# Patient Record
Sex: Female | Born: 1995 | Hispanic: No | Marital: Single | Smoking: Never smoker
Health system: Southern US, Community
[De-identification: ages and names within clinical notes are randomized; demographics above are authoritative.]

## PROBLEM LIST (undated history)

## (undated) ENCOUNTER — Inpatient Hospital Stay (HOSPITAL_COMMUNITY): Payer: Self-pay

## (undated) ENCOUNTER — Ambulatory Visit: Admission: EM | Payer: Medicaid Other

## (undated) DIAGNOSIS — B009 Herpesviral infection, unspecified: Secondary | ICD-10-CM

## (undated) HISTORY — PX: NO PAST SURGERIES: SHX2092

---

## 2020-02-29 ENCOUNTER — Emergency Department (HOSPITAL_COMMUNITY): Payer: Self-pay

## 2020-02-29 ENCOUNTER — Encounter (HOSPITAL_COMMUNITY): Payer: Self-pay

## 2020-02-29 ENCOUNTER — Emergency Department (HOSPITAL_COMMUNITY)
Admission: EM | Admit: 2020-02-29 | Discharge: 2020-03-01 | Disposition: A | Discharge status: Home / Self Care | Payer: Self-pay | Attending: Emergency Medicine | Admitting: Emergency Medicine

## 2020-02-29 DIAGNOSIS — K529 Noninfective gastroenteritis and colitis, unspecified: Secondary | ICD-10-CM | POA: Insufficient documentation

## 2020-02-29 DIAGNOSIS — R1030 Lower abdominal pain, unspecified: Secondary | ICD-10-CM | POA: Insufficient documentation

## 2020-02-29 DIAGNOSIS — N3000 Acute cystitis without hematuria: Secondary | ICD-10-CM | POA: Insufficient documentation

## 2020-02-29 HISTORY — DX: Herpesviral infection, unspecified: B00.9

## 2020-02-29 LAB — CBC
HCT: 49.5 % — ABNORMAL HIGH (ref 36.0–46.0)
Hemoglobin: 16.1 g/dL — ABNORMAL HIGH (ref 12.0–15.0)
MCH: 29.8 pg (ref 26.0–34.0)
MCHC: 32.5 g/dL (ref 30.0–36.0)
MCV: 91.7 fL (ref 80.0–100.0)
Platelets: 423 10*3/uL — ABNORMAL HIGH (ref 150–400)
RBC: 5.4 MIL/uL — ABNORMAL HIGH (ref 3.87–5.11)
RDW: 12.9 % (ref 11.5–15.5)
WBC: 26.1 10*3/uL — ABNORMAL HIGH (ref 4.0–10.5)
nRBC: 0 % (ref 0.0–0.2)

## 2020-02-29 LAB — COMPREHENSIVE METABOLIC PANEL
ALT: 14 U/L (ref 0–44)
AST: 17 U/L (ref 15–41)
Albumin: 4.3 g/dL (ref 3.5–5.0)
Alkaline Phosphatase: 60 U/L (ref 38–126)
Anion gap: 16 — ABNORMAL HIGH (ref 5–15)
BUN: 8 mg/dL (ref 6–20)
CO2: 21 mmol/L — ABNORMAL LOW (ref 22–32)
Calcium: 9.9 mg/dL (ref 8.9–10.3)
Chloride: 103 mmol/L (ref 98–111)
Creatinine, Ser: 0.87 mg/dL (ref 0.44–1.00)
GFR calc Af Amer: >60 mL/min (ref >60)
GFR calc non Af Amer: >60 mL/min (ref >60)
Glucose, Bld: 130 mg/dL — ABNORMAL HIGH (ref 70–99)
Potassium: 4 mmol/L (ref 3.5–5.1)
Sodium: 140 mmol/L (ref 135–145)
Total Bilirubin: 2.3 mg/dL — ABNORMAL HIGH (ref 0.3–1.2)
Total Protein: 8.3 g/dL — ABNORMAL HIGH (ref 6.5–8.1)

## 2020-02-29 LAB — URINALYSIS, ROUTINE W REFLEX MICROSCOPIC
Bilirubin Urine: NEGATIVE
Glucose, UA: 50 mg/dL — AB
Ketones, ur: 80 mg/dL — AB
Leukocytes,Ua: NEGATIVE
Nitrite: NEGATIVE
Protein, ur: >=300 mg/dL — AB
RBC / HPF: >50 RBC/hpf — ABNORMAL HIGH (ref 0–5)
Specific Gravity, Urine: 1.032 — ABNORMAL HIGH (ref 1.005–1.030)
WBC, UA: >50 WBC/hpf — ABNORMAL HIGH (ref 0–5)
pH: 5 (ref 5.0–8.0)

## 2020-02-29 LAB — LIPASE, BLOOD: Lipase: 17 U/L (ref 11–51)

## 2020-02-29 LAB — I-STAT BETA HCG BLOOD, ED (MC, WL, AP ONLY): I-stat hCG, quantitative: <5 m[IU]/mL (ref <5)

## 2020-02-29 MED ORDER — ONDANSETRON HCL 4 MG/2ML IJ SOLN
4.0000 mg | Freq: Once | INTRAMUSCULAR | Status: AC
  Administered 2020-02-29: 4 mg via INTRAVENOUS
  Filled 2020-02-29: qty 2

## 2020-02-29 MED ORDER — CIPROFLOXACIN HCL 500 MG PO TABS
500.0000 mg | ORAL_TABLET | Freq: Two times a day (BID) | ORAL | 0 refills | Status: DC
Start: 2020-02-29 — End: 2020-11-14

## 2020-02-29 MED ORDER — ONDANSETRON 8 MG PO TBDP: 8.0000 mg | ORAL_TABLET | Freq: Three times a day (TID) | ORAL | 0 refills | Status: DC | PRN

## 2020-02-29 MED ORDER — SODIUM CHLORIDE 0.9% FLUSH
3.0000 mL | Freq: Once | INTRAVENOUS | Status: AC
  Administered 2020-02-29: 3 mL via INTRAVENOUS

## 2020-02-29 MED ORDER — ONDANSETRON 4 MG PO TBDP
4.0000 mg | ORAL_TABLET | Freq: Once | ORAL | Status: AC | PRN
  Administered 2020-02-29: 4 mg via ORAL
  Filled 2020-02-29: qty 1

## 2020-02-29 MED ORDER — SODIUM CHLORIDE 0.9 % IV SOLN
1.0000 g | Freq: Once | INTRAVENOUS | Status: AC
  Administered 2020-02-29: 1 g via INTRAVENOUS
  Filled 2020-02-29: qty 10

## 2020-02-29 MED ORDER — IOHEXOL 300 MG/ML  SOLN
100.0000 mL | Freq: Once | INTRAMUSCULAR | Status: AC | PRN
  Administered 2020-02-29: 100 mL via INTRAVENOUS

## 2020-02-29 MED ORDER — DICYCLOMINE HCL 20 MG PO TABS
20.0000 mg | ORAL_TABLET | Freq: Two times a day (BID) | ORAL | 0 refills | Status: DC
Start: 2020-02-29 — End: 2020-11-14

## 2020-02-29 MED ORDER — MORPHINE SULFATE (PF) 4 MG/ML IV SOLN
4.0000 mg | Freq: Once | INTRAVENOUS | Status: AC
  Administered 2020-02-29: 4 mg via INTRAVENOUS
  Filled 2020-02-29: qty 1

## 2020-02-29 NOTE — ED Provider Notes (Signed)
[facility] EMERGENCY DEPARTMENT Provider Note   CSN: [id number] Arrival date & time: 02/29/20  1750     History Chief Complaint  Patient presents with  . Abdominal Pain    Robin Hall is a 24 y.o. female.  HPI   Patient presents to the ED for evaluation of abdominal pain.  Patient states the symptoms started today.  She has had issues with nausea and vomiting and diarrhea.  Patient began having severe pain primarily in her lower abdomen.  Patient denies any dysuria.  She denies any vaginal discharge or bleeding.    Past Medical History:  Diagnosis Date  . Herpes simplex     History reviewed. No pertinent surgical history.   OB History   No obstetric history on file.     No family history on file.  Social History   Tobacco Use  . Smoking status: Not on file  Substance Use Topics  . Alcohol use: Not on file  . Drug use: Not on file    Home Medications Prior to Admission medications   Medication Sig Start Date End Date Taking? Authorizing Provider  ciprofloxacin (CIPRO) 500 MG tablet Take 1 tablet (500 mg total) by mouth 2 (two) times daily. 02/29/20   Linwood Dibbles, MD  dicyclomine (BENTYL) 20 MG tablet Take 1 tablet (20 mg total) by mouth 2 (two) times daily. 02/29/20   Linwood Dibbles, MD  ondansetron (ZOFRAN ODT) 8 MG disintegrating tablet Take 1 tablet (8 mg total) by mouth every 8 (eight) hours as needed for nausea or vomiting. 02/29/20   Linwood Dibbles, MD    Allergies    Patient has no allergy information on record.  Review of Systems   Review of Systems  All other systems reviewed and are negative.   Physical Exam Updated Vital Signs BP (!) 98/58 (BP Location: Right Arm)   Pulse 90   Temp 98 F (36.7 C) (Oral)   Resp 16   SpO2 95%   Physical Exam Vitals and nursing note reviewed.  Constitutional:      General: She is not in acute distress.    Appearance: She is well-developed.  HENT:     Head: Normocephalic and atraumatic.     Right  Ear: External ear normal.     Left Ear: External ear normal.  Eyes:     General: No scleral icterus.       Right eye: No discharge.        Left eye: No discharge.     Conjunctiva/sclera: Conjunctivae normal.  Neck:     Trachea: No tracheal deviation.  Cardiovascular:     Rate and Rhythm: Normal rate and regular rhythm.  Pulmonary:     Effort: Pulmonary effort is normal. No respiratory distress.     Breath sounds: Normal breath sounds. No stridor. No wheezing or rales.  Abdominal:     General: Bowel sounds are normal. There is no distension.     Palpations: Abdomen is soft.     Tenderness: There is abdominal tenderness in the right lower quadrant, suprapubic area and left lower quadrant. There is no guarding or rebound.     Hernia: No hernia is present.  Musculoskeletal:        General: No tenderness.     Cervical back: Neck supple.  Skin:    General: Skin is warm and dry.     Findings: No rash.  Neurological:     Mental Status: She is alert.  Cranial Nerves: No cranial nerve deficit (no facial droop, extraocular movements intact, no slurred speech).     Sensory: No sensory deficit.     Motor: No abnormal muscle tone or seizure activity.     Coordination: Coordination normal.     ED Results / Procedures / Treatments   Labs (all labs ordered are listed, but only abnormal results are displayed) Labs Reviewed  COMPREHENSIVE METABOLIC PANEL - Abnormal; Notable for the following components:      Result Value   CO2 21 (*)    Glucose, Bld 130 (*)    Total Protein 8.3 (*)    Total Bilirubin 2.3 (*)    Anion gap 16 (*)    All other components within normal limits  CBC - Abnormal; Notable for the following components:   WBC 26.1 (*)    RBC 5.40 (*)    Hemoglobin 16.1 (*)    HCT 49.5 (*)    Platelets 423 (*)    All other components within normal limits  URINALYSIS, ROUTINE W REFLEX MICROSCOPIC - Abnormal; Notable for the following components:   Color, Urine AMBER (*)     APPearance HAZY (*)    Specific Gravity, Urine 1.032 (*)    Glucose, UA 50 (*)    Hgb urine dipstick LARGE (*)    Ketones, ur 80 (*)    Protein, ur >=300 (*)    RBC / HPF >50 (*)    WBC, UA >50 (*)    Bacteria, UA FEW (*)    All other components within normal limits  URINE CULTURE  LIPASE, BLOOD  I-STAT BETA HCG BLOOD, ED ([facility], [facility], [facility] ONLY)    EKG None  Radiology CT ABDOMEN PELVIS W CONTRAST  Result Date: 02/29/2020 CLINICAL DATA:  Bilateral lower abdominal pain EXAM: CT ABDOMEN AND PELVIS WITH CONTRAST TECHNIQUE: Multidetector CT imaging of the abdomen and pelvis was performed using the standard protocol following bolus administration of intravenous contrast. CONTRAST:  OMNIPAQUE IOHEXOL 300 MG/ML  SOLN COMPARISON:  None FINDINGS: Lower chest: Lung bases are clear. Normal heart size. No pericardial effusion. Hepatobiliary: No worrisome focal liver abnormality is seen. Normal gallbladder. No visible calcified gallstones. No biliary ductal dilatation. Pancreas: Unremarkable. No pancreatic ductal dilatation or surrounding inflammatory changes. Spleen: Normal in size without focal abnormality. Adrenals/Urinary Tract: Normal adrenal glands. Kidneys are normally located with symmetric enhancement. No suspicious renal lesion, urolithiasis or hydronephrosis. Urinary bladder is largely decompressed at the time of exam and therefore poorly evaluated by CT imaging. Mild wall thickening likely related to underdistention though could correlate for urinary symptoms. Stomach/Bowel: Distal esophagus, stomach and duodenum are unremarkable. No proximal small thickening or dilatation. Distal ileum is diffusely fluid-filled with mild hyperemic mural thickening increased vascularity of the adjacent mesentery. A fluid-filled appearance of the colon is noted proximally with more diffuse pancolonic edematous mural thickening and pericolonic haze. No evidence of high-grade bowel obstruction. Thickening towards the  base of the appendix is favored to be reactive without focal periappendiceal stranding or thickening. Vascular/Lymphatic: No significant vascular findings are present. No enlarged abdominal or pelvic lymph nodes. Reproductive: Anteverted uterus. Heterogeneous uterine enhancement is likely related to contrast timing. Anteverted uterus. No concerning adnexal lesions. Other: No abdominopelvic free air or fluid. No bowel containing hernias. Musculoskeletal: No acute osseous abnormality or suspicious osseous lesion. Abrupt angulation of the coccyx without transcortical lucency to suggest an acute injury. IMPRESSION: 1. Diffusely fluid-filled appearance of the distal ileum, colon and rectum with more diffuse pancolonic  edematous mural thickening and pericolonic haze, consistent with enterocolitis, which may be infectious or inflammatory in etiology. No evidence of high-grade bowel obstruction. 2. Thickening towards the base of the appendix is favored to be reactive without focal periappendiceal stranding or thickening. 3. Mild bladder wall thickening likely underdistention though could correlate for urinary symptoms. Electronically Signed   By: Kreg Shropshire M.D.   On: 02/29/2020 22:37    Procedures Procedures (including critical care time)  Medications Ordered in ED Medications  sodium chloride flush (NS) 0.9 % injection 3 mL (3 mLs Intravenous Given 02/29/20 2157)  ondansetron (ZOFRAN-ODT) disintegrating tablet 4 mg (4 mg Oral Given 02/29/20 1820)  cefTRIAXone (ROCEPHIN) 1 g in sodium chloride 0.9 % 100 mL IVPB (1 g Intravenous New Bag/Given (Non-Interop) 02/29/20 2156)  morphine 4 MG/ML injection 4 mg (4 mg Intravenous Given 02/29/20 2157)  ondansetron (ZOFRAN) injection 4 mg (4 mg Intravenous Given 02/29/20 2157)  iohexol (OMNIPAQUE) 300 MG/ML solution 100 mL (100 mLs Intravenous Contrast Given 02/29/20 2212)    ED Course  I have reviewed the triage vital signs and the nursing notes.  Pertinent labs & imaging  results that were available during my care of the patient were reviewed by me and considered in my medical decision making (see chart for details).  Clinical Course as of Mar 01 2339  Mon Feb 29, 2020  2126 Labs reviewed.  Metabolic panel show elevated bilirubin but otherwise normal LFTs.  CBC shows significantly elevated white blood cell count.  Urinalysis suggest urinary tract infection.   [JK]  2252 CT scan suggest enterocolitis.  No signs of appendicitis.  Bladder thickening also noted.   [JK]    Clinical Course User Index [JK] Linwood Dibbles, MD   MDM Rules/Calculators/A&P                          Patient presents to the ED for evaluation of abdominal pain vomiting and diarrhea.  Patient's work-up shows a significant leukocytosis.  She also appeared to have a component of dehydration.  Patient was hydrated with IV fluids.  She was also given antibiotics for her urinary tract infection.  CT scan showed evidence of enterocolitis but no signs of appendicitis.  There was some bladder thickening that would correlate with her urinary tract infection.  Patient states she is feeling better.  No further episodes of vomiting.  She was given a dose of antibiotics.  Patient is tolerating oral fluids.  Plan on discharge home with prescription for Bentyl.  Considering the GI findings, possible pyelo I will prescribe her Cipro and have her take her for a longer duration of time rather than for simple UTI. Final Clinical Impression(s) / ED Diagnoses Final diagnoses:  Enterocolitis  Acute cystitis without hematuria    Rx / DC Orders ED Discharge Orders         Ordered    ciprofloxacin (CIPRO) 500 MG tablet  2 times daily     Discontinue  Reprint     02/29/20 2339    dicyclomine (BENTYL) 20 MG tablet  2 times daily     Discontinue  Reprint     02/29/20 2339    ondansetron (ZOFRAN ODT) 8 MG disintegrating tablet  Every 8 hours PRN     Discontinue  Reprint     02/29/20 2339           Linwood Dibbles,  MD 02/29/20 2352

## 2020-02-29 NOTE — Discharge Instructions (Signed)
Take the antibiotics as prescribed the Bentyl is for the cramping pain.  Return to the ER for worsening symptoms.  Follow-up with your doctor to be rechecked in a couple of days

## 2020-02-29 NOTE — ED Triage Notes (Signed)
Pt arrives to ED w/ c/o 10/10 R/LLQ abdominal pain that started today. Pt endorses n/v/d.

## 2020-02-29 NOTE — ED Notes (Signed)
Patient transported to CT 

## 2020-03-02 LAB — URINE CULTURE: Culture: NO GROWTH

## 2020-11-14 ENCOUNTER — Other Ambulatory Visit: Payer: Self-pay

## 2020-11-14 ENCOUNTER — Inpatient Hospital Stay (HOSPITAL_COMMUNITY)
Admission: AD | Admit: 2020-11-14 | Discharge: 2020-11-14 | Disposition: A | Discharge status: Home / Self Care | Payer: Medicaid Other | Attending: Family Medicine | Admitting: Family Medicine

## 2020-11-14 ENCOUNTER — Inpatient Hospital Stay (HOSPITAL_COMMUNITY): Payer: Medicaid Other

## 2020-11-14 ENCOUNTER — Encounter (HOSPITAL_COMMUNITY): Payer: Self-pay | Admitting: Obstetrics and Gynecology

## 2020-11-14 DIAGNOSIS — O360110 Maternal care for anti-D [Rh] antibodies, first trimester, not applicable or unspecified: Secondary | ICD-10-CM

## 2020-11-14 DIAGNOSIS — O4691 Antepartum hemorrhage, unspecified, first trimester: Secondary | ICD-10-CM

## 2020-11-14 DIAGNOSIS — Z3A01 Less than 8 weeks gestation of pregnancy: Secondary | ICD-10-CM | POA: Diagnosis not present

## 2020-11-14 DIAGNOSIS — Z792 Long term (current) use of antibiotics: Secondary | ICD-10-CM | POA: Insufficient documentation

## 2020-11-14 DIAGNOSIS — Z79899 Other long term (current) drug therapy: Secondary | ICD-10-CM | POA: Insufficient documentation

## 2020-11-14 DIAGNOSIS — O209 Hemorrhage in early pregnancy, unspecified: Secondary | ICD-10-CM | POA: Insufficient documentation

## 2020-11-14 DIAGNOSIS — O98511 Other viral diseases complicating pregnancy, first trimester: Secondary | ICD-10-CM | POA: Diagnosis not present

## 2020-11-14 DIAGNOSIS — A6000 Herpesviral infection of urogenital system, unspecified: Secondary | ICD-10-CM | POA: Diagnosis not present

## 2020-11-14 DIAGNOSIS — O3680X0 Pregnancy with inconclusive fetal viability, not applicable or unspecified: Secondary | ICD-10-CM | POA: Insufficient documentation

## 2020-11-14 LAB — ABO/RH
ABO/RH(D): A NEG
Antibody Screen: NEGATIVE

## 2020-11-14 LAB — URINALYSIS, ROUTINE W REFLEX MICROSCOPIC
Bilirubin Urine: NEGATIVE
Glucose, UA: NEGATIVE mg/dL
Ketones, ur: NEGATIVE mg/dL
Leukocytes,Ua: NEGATIVE
Nitrite: NEGATIVE
Protein, ur: NEGATIVE mg/dL
Specific Gravity, Urine: >1.03 — ABNORMAL HIGH (ref 1.005–1.030)
pH: 5.5 (ref 5.0–8.0)

## 2020-11-14 LAB — CBC
HCT: 42.7 % (ref 36.0–46.0)
Hemoglobin: 14.6 g/dL (ref 12.0–15.0)
MCH: 30.8 pg (ref 26.0–34.0)
MCHC: 34.2 g/dL (ref 30.0–36.0)
MCV: 90.1 fL (ref 80.0–100.0)
Platelets: 366 10*3/uL (ref 150–400)
RBC: 4.74 MIL/uL (ref 3.87–5.11)
RDW: 12.7 % (ref 11.5–15.5)
WBC: 8.9 10*3/uL (ref 4.0–10.5)
nRBC: 0 % (ref 0.0–0.2)

## 2020-11-14 LAB — POCT PREGNANCY, URINE: Preg Test, Ur: POSITIVE — AB

## 2020-11-14 LAB — URINALYSIS, MICROSCOPIC (REFLEX)

## 2020-11-14 LAB — WET PREP, GENITAL
Clue Cells Wet Prep HPF POC: NONE SEEN
Sperm: NONE SEEN
Trich, Wet Prep: NONE SEEN
Yeast Wet Prep HPF POC: NONE SEEN

## 2020-11-14 LAB — HCG, QUANTITATIVE, PREGNANCY: hCG, Beta Chain, Quant, S: 199 m[IU]/mL — ABNORMAL HIGH (ref <5)

## 2020-11-14 NOTE — MAU Note (Signed)
Presents with c/o VB that began Friday, reports VB isn't heavy.  Denies cramping.  +HPT.  LMP  approx 09/27/2020.

## 2020-11-14 NOTE — MAU Provider Note (Signed)
History     [id number]  Arrival date and time: 11/14/20 1007    Chief Complaint  Patient presents with   Vaginal Bleeding   HPI Robin Hall is a 25 y.o. G1P0 at [redacted]w[redacted]d by LMP with a PMHx of genital HSV who presents for first trimester bleeding.  This began 3 days ago (11/11/20) and has never been heavy or with clots. It is lighter than a period and does not saturate pads. She denies N/V, dizziness, and present pain, but has had intermittent cramping since onset. There are no alleviating factors.  OB History    Gravida  1   Para      Term      Preterm      AB      Living        SAB      IAB      Ectopic      Multiple      Live Births              Past Medical History:  Diagnosis Date   Herpes simplex     Past Surgical History:  Procedure Laterality Date   NO PAST SURGERIES      Family History  Problem Relation Age of Onset   Diabetes Mother    Thyroid disease Father     Social History   Socioeconomic History   Marital status: Single    Spouse name: Not on file   Number of children: Not on file   Years of education: Not on file   Highest education level: Not on file  Occupational History   Not on file  Tobacco Use   Smoking status: Never Smoker   Smokeless tobacco: Never Used  Vaping Use   Vaping Use: Never used  Substance and Sexual Activity   Alcohol use: Not Currently   Drug use: Yes    Types: Marijuana    Comment: Last smoked 11/13/2020   Sexual activity: Yes  Other Topics Concern   Not on file  Social History Narrative   Not on file   Social Determinants of Health   Financial Resource Strain: Not on file  Food Insecurity: Not on file  Transportation Needs: Not on file  Physical Activity: Not on file  Stress: Not on file  Social Connections: Not on file  Intimate Partner Violence: Not on file    No Known Allergies  No current facility-administered medications on file prior to encounter.   Current  Outpatient Medications on File Prior to Encounter  Medication Sig Dispense Refill   valACYclovir (VALTREX) 1000 MG tablet Take 1,000 mg by mouth as needed.     ciprofloxacin (CIPRO) 500 MG tablet Take 1 tablet (500 mg total) by mouth 2 (two) times daily. 14 tablet 0   dicyclomine (BENTYL) 20 MG tablet Take 1 tablet (20 mg total) by mouth 2 (two) times daily. 20 tablet 0   ondansetron (ZOFRAN ODT) 8 MG disintegrating tablet Take 1 tablet (8 mg total) by mouth every 8 (eight) hours as needed for nausea or vomiting. 12 tablet 0     Review of Systems  Constitutional: Negative for diaphoresis, fever and malaise/fatigue.  Gastrointestinal: Negative for abdominal pain, nausea and vomiting.  Neurological: Negative for dizziness and headaches.   Pertinent positives and negative per HPI, all others reviewed and negative  Physical Exam   BP 99/65 (BP Location: Right Arm)    Pulse 67    Temp 98.2 F (36.8 C) (Oral)  Resp 17    Ht 5\' 1"  (1.549 m)    Wt 61.4 kg    LMP 09/27/2020    SpO2 100%    BMI 25.58 kg/m   Physical Exam Constitutional:      General: She is awake. She is not in acute distress.    Appearance: Normal appearance. She is not ill-appearing.  Cardiovascular:     Rate and Rhythm: Normal rate and regular rhythm.     Heart sounds: Normal heart sounds, S1 normal and S2 normal.  Pulmonary:     Effort: Pulmonary effort is normal.     Breath sounds: Normal breath sounds.  Abdominal:     General: Abdomen is flat. Bowel sounds are normal. There is no distension. There are no signs of injury.     Tenderness: There is no abdominal tenderness.  Neurological:     Mental Status: She is alert.  Psychiatric:        Behavior: Behavior is cooperative.    Labs Results for orders placed or performed during the hospital encounter of 11/14/20 (from the past 24 hour(s))  Pregnancy, urine POC     Status: Abnormal   Collection Time: 11/14/20 10:32 AM  Result Value Ref Range   Preg Test,  Ur POSITIVE (A) NEGATIVE  Urinalysis, Routine w reflex microscopic Urine, Clean Catch     Status: Abnormal   Collection Time: 11/14/20 10:35 AM  Result Value Ref Range   Color, Urine YELLOW YELLOW   APPearance CLEAR CLEAR   Specific Gravity, Urine >1.030 (H) 1.005 - 1.030   pH 5.5 5.0 - 8.0   Glucose, UA NEGATIVE NEGATIVE mg/dL   Hgb urine dipstick LARGE (A) NEGATIVE   Bilirubin Urine NEGATIVE NEGATIVE   Ketones, ur NEGATIVE NEGATIVE mg/dL   Protein, ur NEGATIVE NEGATIVE mg/dL   Nitrite NEGATIVE NEGATIVE   Leukocytes,Ua NEGATIVE NEGATIVE  Urinalysis, Microscopic (reflex)     Status: Abnormal   Collection Time: 11/14/20 10:35 AM  Result Value Ref Range   RBC / HPF 0-5 0 - 5 RBC/hpf   WBC, UA 0-5 0 - 5 WBC/hpf   Bacteria, UA FEW (A) NONE SEEN   Squamous Epithelial / LPF 6-10 0 - 5   Mucus PRESENT   CBC     Status: None   Collection Time: 11/14/20 12:18 PM  Result Value Ref Range   WBC 8.9 4.0 - 10.5 K/uL   RBC 4.74 3.87 - 5.11 MIL/uL   Hemoglobin 14.6 12.0 - 15.0 g/dL   HCT 11/16/20 08.6 - 57.8 %   MCV 90.1 80.0 - 100.0 fL   MCH 30.8 26.0 - 34.0 pg   MCHC 34.2 30.0 - 36.0 g/dL   RDW 46.9 62.9 - 52.8 %   Platelets 366 150 - 400 K/uL   nRBC 0.0 0.0 - 0.2 %  ABO/Rh     Status: None   Collection Time: 11/14/20 12:18 PM  Result Value Ref Range   ABO/RH(D) A NEG    Antibody Screen      NEG Performed at [facility], [address], [place], [place]   hCG, quantitative, pregnancy     Status: Abnormal   Collection Time: 11/14/20 12:18 PM  Result Value Ref Range   hCG, Beta Chain, Quant, S 199 (H) <5 mIU/mL  Wet prep, genital     Status: Abnormal   Collection Time: 11/14/20 12:38 PM   Specimen: PATH Cytology Cervicovaginal Ancillary Only  Result Value Ref Range   Yeast  Wet Prep HPF POC NONE SEEN NONE SEEN   Trich, Wet Prep NONE SEEN NONE SEEN   Clue Cells Wet Prep HPF POC NONE SEEN NONE SEEN   WBC, Wet Prep HPF POC MANY (A) NONE SEEN   Sperm NONE SEEN      Imaging US OB LESS THAN 14 WEEKS WITH OB TRANSVAGINAL  Result Date: 11/14/2020 CLINICAL DATA:  Vaginal bleeding affecting first trimester pregnancy; LMP 09/27/2020; no quantitative beta HCG available for correlation EXAM: OBSTETRIC <14 WK [place] AND TRANSVAGINAL OB US TECHNIQUE: Both transabdominal and transvaginal ultrasound examinations were performed for complete evaluation of the gestation as well as the maternal uterus, adnexal regions, and pelvic cul-de-sac. Transvaginal technique was performed to assess early pregnancy. COMPARISON:  None FINDINGS: Intrauterine gestational sac: Not identified Yolk sac:  N/A Embryo:  N/A Cardiac Activity: N/A Heart Rate: N/A  bpm MSD:   mm    w     d CRL:    mm    w    d                  [place] EDC: Subchorionic hemorrhage:  N/A Maternal uterus/adnexae: Uterus anteverted, normal appearance. No evidence of uterine mass or gestational sac. Heterogeneous appearance of tissue along the endocervical canal likely prominent endocervical glandular tissue. Endometrial complex normal appearance 3 mm thick. RIGHT ovary normal size and morphology 2.7 x 1.9 x 1.4 cm. LEFT ovary normal size and morphology 1.7 x 1.3 x 3.0 cm. No free pelvic fluid or adnexal masses. IMPRESSION: No intrauterine gestation identified. Findings are consistent with pregnancy of unknown location. Differential diagnosis includes early intrauterine pregnancy too early to visualize, spontaneous abortion, and ectopic pregnancy. Serial quantitative beta HCG and or follow-up ultrasound recommended to definitively exclude ectopic pregnancy. Electronically Signed   By: Ulyses Southward M.D.   On: 11/14/2020 14:04    MAU Course  Procedures  Lab Orders     Wet prep, genital     Urinalysis, Routine w reflex microscopic Urine, Clean Catch     Urinalysis, Microscopic (reflex)     CBC     hCG, quantitative, pregnancy     Pregnancy, urine POC No orders of the defined types were placed in this encounter.   Imaging  Orders     US OB LESS THAN 14 WEEKS WITH OB TRANSVAGINAL   Assessment and Plan  1. First trimester bleeding -Pregnancy of unknown location: beta-hCG 199 (consistent with 2-[redacted] week gestation) in context of [redacted]w[redacted]d gestation by LMP. On [place] endometrial thickness without IUP/gestational sac visualized. Pt scheduled to return Wednesday to [facility] for serial hCG to r/o miscarriage or ectopic. -Blood typing complete and pt Rh negative. No f/u needed for Rhogam for spotting before 8 weeks. -Infectious workup still pending: Negative wet prep, GC/Chlamydia pending. Will contact pt with positive results. -Pt counseled about s/s of ectopic and to come to MAU with new abdominal pain.  Monika Salk, MS3

## 2020-11-14 NOTE — Discharge Instructions (Signed)
Ectopic Pregnancy  An ectopic pregnancy happens when a fertilized egg grows outside of the womb (uterus). Fertilized means that sperm entered the egg. The egg cannot stay alive outside of the womb. What are the causes? The most common cause is damage to a fallopian tube. This damage stops the egg from getting to the womb. Instead, the egg stays in the tube. Sometimes, an ectopic pregnancy happens in other parts of the body. What increases the risk?  Getting treatment before to help you have a baby.  A past pregnancy outside of the womb.  A past surgery to have your tubes tied.  Getting pregnant while using a device in the womb to avoid getting pregnant.  Taking birth control pills before the age of 69.  Smoking or drinking alcohol.  Having a mother who took a medicine called DES many years ago. What are the signs or symptoms? Common symptoms of this condition include:  Missing a menstrual period.  Feeling like you may vomit.  Tiredness.  Breast pain.  Other signs that you are pregnant. Other symptoms may include:  Pain during sex.  Bleeding from the vagina.  Belly pain.  A fast heartbeat, low blood pressure, and sweating.  Pain or extra pressure while pooping (having a bowel movement). If your tube tears or bursts:  You may have sudden and very bad pain in your belly.  You may feel dizzy, weak, or light-headed.  You may faint.  You may have pain in your shoulder or neck. A torn or burst tube is an emergency. It can be life-threatening. How is this treated? This condition may be treated with:  Medicine. This may be given if: ? The pregnancy is found early and you are not bleeding. ? The tube has not torn or burst.  Surgery. This may be done to: ? Take out the pregnancy tissue. ? Stop bleeding. ? Take out part or all of the tube. ? Take out the womb. This is rare.  Blood tests.  Careful watching. Follow these instructions at home: Medicines  Take  over-the-counter and prescription medicines only as told by your doctor.  If told, take steps to prevent problems with pooping (constipation). You may need to: ? Drink enough fluid to keep your pee (urine) pale yellow. ? Take medicines. You will be told what medicines to take. ? Eat foods that are high in fiber. These include beans, whole grains, and fresh fruits and vegetables. ? Limit foods that are high in fat and sugar. These include fried or sweet foods.  Ask your doctor if you should avoid driving or using machines while you are taking your medicine. General instructions  Rest or limit your activities, if told to do this.  Do not have sex for 6 weeks or as told by your doctor.  Do not put tampons, vaginal cleaning wash (douche), or other things in your vagina. Do not use these things for 6 weeks or until your doctor says it is safe to use them.  Do not lift anything that is heavier than 10 lb (4.5 kg), or the limit that you are told.  Return to your normal activities when your doctor says that it is safe.  Keep all follow-up visits. Contact a doctor if:  You have a fever or chills.  You feel like you may vomit and you vomit. Get help right away if:  Your pain gets worse or is not helped by medicine.  You feel dizzy or weak.  You feel  light-headed.  You faint.  You have sudden and very bad pain in your belly.  You have very bad pain in your shoulder or neck. Summary  An ectopic pregnancy happens when a fertilized egg grows outside the womb.  This is an emergency.  The most common cause is damage to one of the fallopian tubes.  This condition may be treated with medicine, surgery, blood tests, or careful watching. This information is not intended to replace advice given to you by your health care provider. Make sure you discuss any questions you have with your health care provider. Document Revised: 12/04/2019 Document Reviewed: 11/24/2019 Elsevier Patient  Education  2021 [employer] Inc. Abdominal Pain During Pregnancy Belly (abdominal) pain is common during pregnancy. There are many possible causes. Some causes are more serious than others. Sometimes the cause is not known. Always tell your doctor if you have belly pain. Follow these instructions at home:  Do not have sex or put anything in your vagina until your pain goes away completely.  Get plenty of rest until your pain gets better.  Drink enough fluid to keep your pee (urine) pale yellow.  Take over-the-counter and prescription medicines only as told by your doctor.  Keep all follow-up visits.   Contact a doctor if:  You keep having pain after resting.  Your pain gets worse after resting.  You have lower belly pain that: ? Comes and goes at regular times. ? Spreads to your back. ? Feels like menstrual cramps.  You have pain or burning when you pee (urinate). Get help right away if:  You have a fever or chills.  You feel like it is hard to breathe.  You have bleeding from your vagina.  You are leaking fluid or tissue from your vagina.  You vomit for more than 24 hours.  You have watery poop (diarrhea) for more than 24 hours.  Your baby is moving less than usual.  You feel very weak or faint.  You have very bad pain in your upper belly. Summary  Belly pain is common during pregnancy. There are many possible causes.  If you have belly pain during pregnancy, tell your doctor right away.  Keep all follow-up visits. This information is not intended to replace advice given to you by your health care provider. Make sure you discuss any questions you have with your health care provider. Document Revised: 04/26/2020 Document Reviewed: 04/26/2020 Elsevier Patient Education  2021 [employer].

## 2020-11-14 NOTE — Progress Notes (Signed)
GC/Chlamydia & wet prep cultures obtained via pt self swabbed.

## 2020-11-15 LAB — GC/CHLAMYDIA PROBE AMP (~~LOC~~) NOT AT ARMC
Chlamydia: NEGATIVE
Comment: NEGATIVE
Comment: NORMAL
Neisseria Gonorrhea: NEGATIVE

## 2020-11-16 ENCOUNTER — Ambulatory Visit (INDEPENDENT_AMBULATORY_CARE_PROVIDER_SITE_OTHER): Payer: Self-pay

## 2020-11-16 ENCOUNTER — Inpatient Hospital Stay (HOSPITAL_COMMUNITY)
Admission: AD | Admit: 2020-11-16 | Discharge: 2020-11-16 | Disposition: A | Discharge status: Home / Self Care | Payer: Medicaid Other | Attending: Obstetrics & Gynecology | Admitting: Obstetrics & Gynecology

## 2020-11-16 ENCOUNTER — Other Ambulatory Visit: Payer: Self-pay

## 2020-11-16 VITALS — BP 99/55 | HR 69 | Wt 131.8 lb

## 2020-11-16 DIAGNOSIS — Z6711 Type A blood, Rh negative: Secondary | ICD-10-CM

## 2020-11-16 DIAGNOSIS — O3680X0 Pregnancy with inconclusive fetal viability, not applicable or unspecified: Secondary | ICD-10-CM | POA: Diagnosis present

## 2020-11-16 LAB — BETA HCG QUANT (REF LAB): hCG Quant: 253 m[IU]/mL

## 2020-11-16 MED ORDER — RHO D IMMUNE GLOBULIN 1500 UNIT/2ML IJ SOSY
300.0000 ug | PREFILLED_SYRINGE | Freq: Once | INTRAMUSCULAR | Status: AC
  Administered 2020-11-16: 300 ug via INTRAMUSCULAR
  Filled 2020-11-16: qty 2

## 2020-11-16 MED ORDER — METHOTREXATE FOR ECTOPIC PREGNANCY: 50.0000 mg/m2 | Freq: Once | INTRAMUSCULAR | Status: DC

## 2020-11-16 NOTE — Discharge Instructions (Signed)
Rh Incompatibility Rh incompatibility is a condition that occurs during pregnancy if a woman has Rh-negative blood and her baby has Rh-positive blood. "Rh-negative" and "Rh-positive" refer to whether or not the blood has a specific protein found on the surface of red blood cells (Rh factor). If a woman has Rh factor, she is Rh-positive. If she does not have an Rh factor, she is Rh-negative. Having or not having an Rh factor does not affect the mother's general health. However, it can cause problems during pregnancy. What kind of problems can Rh incompatibility cause? During pregnancy, blood from the baby can cross into the mother's bloodstream, especially during delivery. If a mother is Rh-negative and the baby is Rh-positive, the mother's defense (immune) system will react to the baby's blood as if it were a foreign substance and will create certain proteins (antibodies) in response to it. This process is called sensitization. Once the mother is sensitized, her Rh antibodies will cross the placenta in future pregnancies and attack the baby's Rh-positive blood as if it were a harmful substance. Rh incompatibility can also happen if a pregnant woman who is Rh-negative receives a donation (transfusion) of Rh-positive blood. How does this condition affect my baby? The Rh antibodies that attack and destroy your baby's red blood cells can lead to hemolytic disease in the baby. This is a condition in which red blood cells break down. This can cause:  Yellowing of the skin and the whites of the eyes (jaundice).  Fewer red blood cells in the body (anemia).  Brain damage.  Heart failure.  Death. These antibodies usually do not cause problems during a woman's first pregnancy. This is because blood from the baby often crosses into the mother's bloodstream during delivery, and the baby is born before many of the antibodies can develop. However, once antibodies have formed, they stay in a woman's body. Because  of this, Rh incompatibility is more likely to cause problems in second or later pregnancies (if the baby is Rh-positive). How is this diagnosed? When you become pregnant, you may have blood tests to determine your blood type and Rh factor. If you are Rh-negative, you may have another blood test called an antibody screen. The antibody screen shows whether you have Rh antibodies in your blood. If you do, it may mean that you have been exposed to Rh-positive blood before, and that you have a risk for Rh incompatibility. To find out whether your baby is developing hemolytic anemia and how serious it is, health care providers may use more advanced tests, such as ultrasound.   How is Rh incompatibility treated? This condition is treated with two shots (injections) of medicine called Rho (D) immune globulin. This medicine keeps your body from making antibodies that can cause serious problems for your baby or for future babies. You will get one shot around your seventh month of pregnancy and the other within 72 hours of your baby's birth. If you are Rh-negative, you will need this medicine every time you have a baby with Rh-positive blood. If you are Rh-negative and there is a high risk of blood transfer between you and your baby, you may be given Rho (D) immune globulin. The risk of blood transfer is high if you experience:  Amniocentesis. This is a procedure to remove a small amount of the fluid that surrounds a baby in the uterus (amniotic fluid) so that it can be tested.  A miscarriage or an abortion.  An ectopic pregnancy. This is a pregnancy  in which the fertilized egg attaches (implants) outside the uterus.  Any vaginal bleeding during pregnancy. If you already have antibodies in your blood, your pregnancy will be closely monitored. You will not be given Rho (D) immune globulin because it is not effective in these cases. Summary  Rh incompatibility is a condition that occurs during pregnancy if a  woman has Rh-negative blood and her baby has Rh-positive blood.  If a mother is Rh-negative and the baby is Rh-positive, the mother's immune system will react to the baby's blood as if it were a foreign substance, and will create antibodies.  Rh antibodies that attack and destroy the baby's red blood cells can lead to hemolytic disease in the baby.  This condition is treated with a shot of medicine called Rho (D) immune globulin. This medicine keeps the woman's body from making antibodies that can cause serious problems in the baby or future babies. This information is not intended to replace advice given to you by your health care provider. Make sure you discuss any questions you have with your health care provider. Document Revised: 07/26/2017 Document Reviewed: 10/09/2016 Elsevier Patient Education  2021 [employer].

## 2020-11-16 NOTE — MAU Note (Signed)
...  Robin Hall is a 25 y.o. at [redacted]w[redacted]d here in MAU reporting: she was sent to MAU from the office for a MTX injection due to a pregnancy of unknown location as well as a "small rise in the hCG levels. She states she has questions currently and is second guessing getting the MTX today as she was scheduled for a repeat U/S on this upcoming Monday 11/21/2020. She states she is not experiencing any pain and "my bleeding has gotten better since yesterday."   Pain score: No pain  BP: 110/58 P: 76 T: 98.5 F R: 19 O2: 100%

## 2020-11-16 NOTE — MAU Provider Note (Signed)
History     CSN: [id number]  Arrival date and time: 11/16/20 1820   Event Date/Time   First Provider Initiated Contact with Patient 11/16/20 1948      Chief Complaint  Patient presents with  . Injection   HPI  Ms.Robin Hall is a 25 y.o. female G1P0 here in MAU for treatment of a potential ectopic pregnancy. She was seen in MAU 2 days ago for bleeding. She continues to have very light bleeding, no pain. She was seen today in the office and her Hcg level did not rise appropriately. She was sent over here for counseling for MTX administration. She states she has a lot of questions about this treatment.   OB History    Gravida  1   Para      Term      Preterm      AB      Living        SAB      IAB      Ectopic      Multiple      Live Births              Past Medical History:  Diagnosis Date  . Herpes simplex     Past Surgical History:  Procedure Laterality Date  . NO PAST SURGERIES      Family History  Problem Relation Age of Onset  . Diabetes Mother   . Thyroid disease Father     Social History   Tobacco Use  . Smoking status: Never Smoker  . Smokeless tobacco: Never Used  Vaping Use  . Vaping Use: Never used  Substance Use Topics  . Alcohol use: Not Currently  . Drug use: Yes    Types: Marijuana    Comment: Last smoked 11/13/2020    Allergies: No Known Allergies  Medications Prior to Admission  Medication Sig Dispense Refill Last Dose  . valACYclovir (VALTREX) 1000 MG tablet Take 1,000 mg by mouth as needed. (Patient not taking: Reported on 11/16/2020)      Results for orders placed or performed during the hospital encounter of 11/16/20 (from the past 72 hour(s))  Rh IG workup (includes ABO/Rh)     Status: None (Preliminary result)   Collection Time: 11/16/20  7:44 PM  Result Value Ref Range   Gestational Age(Wks) 7    ABO/RH(D) A NEG    Antibody Screen NEG    Unit Number [id number]    Blood Component Type RHIG    Unit  division 00    Status of Unit ALLOCATED    Transfusion Status      OK TO TRANSFUSE Performed at [facility], [address], [place], [place] [address]    Review of Systems  Gastrointestinal: Negative for abdominal pain.  Genitourinary: Positive for vaginal bleeding and vaginal discharge.   Physical Exam   Last menstrual period 09/27/2020.  Physical Exam HENT:     Head: Normocephalic.  Abdominal:     General: Abdomen is flat.     Tenderness: There is no abdominal tenderness.  Musculoskeletal:        General: Normal range of motion.  Skin:    General: Skin is warm.  Neurological:     Mental Status: She is alert.    MAU Course  Procedures  MDM  3/21 Hcg: 199 3/23 Hcg: 253 [place] on 3/21 shows nothing in the uterus.  Reviewed Hcg levels and [place]. Concerning given the levels did not rise appropriately.  This is a high desired pregnancy and she is stable with no pain and minimal bleeding. She would like to return to MAU in 48 hours for serial quants and make a decision once 3rd quant is resulted. We discussed MTX as the recommended treatment option for ectopic. Strict return precautions reviewed Rhogam given today- A negative blood type   Assessment and Plan   A:  1. Pregnancy of unknown anatomic location   2. Type A blood, Rh negative     P:  Discharge home in stable condition Ectopic precautions Return to MAU on Friday after 7 pm for repeat quant. Return to MAU sooner if symptoms worsen Pelvic rest  Amellia Panik, Harolyn Rutherford, NP 11/16/2020 8:40 PM

## 2020-11-16 NOTE — Progress Notes (Signed)
Robin Hall presents to [facility] for follow-up beta HCG blood draw today. She was seen in MAU for vaginal bleeding and cramping on 11/14/20. Patient denies pain today, endorses vaginal bleeding; vaginal bleeding today is spotting seen when wiping and occasionally in pad. Discussed with patient, we are following hCG levels today. Results will be back in approximately 2 hours. Valid contact number for patient confirmed. I will call the patient with results. Ectopic precautions reviewed.  Beta HCG today is 253, which has risen from 199 on 11/14/20. Results and patient history reviewed with Crissie Reese, MD, who states this is an innapropriate rise in beta HCG and pt should return to MAU for MTX treatment. Patient called and informed of plan for follow-up; pt confirms understanding that she should go immediately to MAU. Called MAU with report, spoke to [facility], [place].   Marjo Bicker 11/16/2020 4:44 PM

## 2020-11-17 LAB — RH IG WORKUP (INCLUDES ABO/RH)
ABO/RH(D): A NEG
Antibody Screen: NEGATIVE
Gestational Age(Wks): 7
Unit division: 0

## 2020-11-17 NOTE — Progress Notes (Signed)
Chart reviewed for nurse visit. Agree with plan of care.   Shaheen Mende M, MD 11/17/20 8:07 AM 

## 2020-11-18 ENCOUNTER — Other Ambulatory Visit: Payer: Self-pay

## 2020-11-18 ENCOUNTER — Inpatient Hospital Stay (HOSPITAL_COMMUNITY)
Admission: AD | Admit: 2020-11-18 | Discharge: 2020-11-18 | Disposition: A | Discharge status: Home / Self Care | Payer: Medicaid Other | Attending: Family Medicine | Admitting: Family Medicine

## 2020-11-18 DIAGNOSIS — O3680X0 Pregnancy with inconclusive fetal viability, not applicable or unspecified: Secondary | ICD-10-CM | POA: Diagnosis not present

## 2020-11-18 DIAGNOSIS — Z3A01 Less than 8 weeks gestation of pregnancy: Secondary | ICD-10-CM

## 2020-11-18 LAB — COMPREHENSIVE METABOLIC PANEL
ALT: 13 U/L (ref 0–44)
AST: 15 U/L (ref 15–41)
Albumin: 3.8 g/dL (ref 3.5–5.0)
Alkaline Phosphatase: 42 U/L (ref 38–126)
Anion gap: 5 (ref 5–15)
BUN: 8 mg/dL (ref 6–20)
CO2: 27 mmol/L (ref 22–32)
Calcium: 9.3 mg/dL (ref 8.9–10.3)
Chloride: 107 mmol/L (ref 98–111)
Creatinine, Ser: 0.65 mg/dL (ref 0.44–1.00)
GFR, Estimated: >60 mL/min (ref >60)
Glucose, Bld: 89 mg/dL (ref 70–99)
Potassium: 4 mmol/L (ref 3.5–5.1)
Sodium: 139 mmol/L (ref 135–145)
Total Bilirubin: 0.8 mg/dL (ref 0.3–1.2)
Total Protein: 6.6 g/dL (ref 6.5–8.1)

## 2020-11-18 LAB — HCG, QUANTITATIVE, PREGNANCY: hCG, Beta Chain, Quant, S: 388 m[IU]/mL — ABNORMAL HIGH (ref <5)

## 2020-11-18 NOTE — Discharge Instructions (Signed)
Return to care   If you have heavier bleeding that soaks through more that 2 pads per hour for an hour or more  If you bleed so much that you feel like you might pass out or you do pass out  If you have significant abdominal pain that is not improved with Tylenol        Ectopic Pregnancy  An ectopic pregnancy happens when a fertilized egg grows outside of the womb (uterus). Fertilized means that sperm entered the egg. The egg cannot stay alive outside of the womb. What are the causes? The most common cause is damage to a fallopian tube. This damage stops the egg from getting to the womb. Instead, the egg stays in the tube. Sometimes, an ectopic pregnancy happens in other parts of the body. What increases the risk?  Getting treatment before to help you have a baby.  A past pregnancy outside of the womb.  A past surgery to have your tubes tied.  Getting pregnant while using a device in the womb to avoid getting pregnant.  Taking birth control pills before the age of 16.  Smoking or drinking alcohol.  Having a mother who took a medicine called DES many years ago. What are the signs or symptoms? Common symptoms of this condition include:  Missing a menstrual period.  Feeling like you may vomit.  Tiredness.  Breast pain.  Other signs that you are pregnant. Other symptoms may include:  Pain during sex.  Bleeding from the vagina.  Belly pain.  A fast heartbeat, low blood pressure, and sweating.  Pain or extra pressure while pooping (having a bowel movement). If your tube tears or bursts:  You may have sudden and very bad pain in your belly.  You may feel dizzy, weak, or light-headed.  You may faint.  You may have pain in your shoulder or neck. A torn or burst tube is an emergency. It can be life-threatening. How is this treated? This condition may be treated with:  Medicine. This may be given if: ? The pregnancy is found early and you are not  bleeding. ? The tube has not torn or burst.  Surgery. This may be done to: ? Take out the pregnancy tissue. ? Stop bleeding. ? Take out part or all of the tube. ? Take out the womb. This is rare.  Blood tests.  Careful watching. Follow these instructions at home: Medicines  Take over-the-counter and prescription medicines only as told by your doctor.  If told, take steps to prevent problems with pooping (constipation). You may need to: ? Drink enough fluid to keep your pee (urine) pale yellow. ? Take medicines. You will be told what medicines to take. ? Eat foods that are high in fiber. These include beans, whole grains, and fresh fruits and vegetables. ? Limit foods that are high in fat and sugar. These include fried or sweet foods.  Ask your doctor if you should avoid driving or using machines while you are taking your medicine. General instructions  Rest or limit your activities, if told to do this.  Do not have sex for 6 weeks or as told by your doctor.  Do not put tampons, vaginal cleaning wash (douche), or other things in your vagina. Do not use these things for 6 weeks or until your doctor says it is safe to use them.  Do not lift anything that is heavier than 10 lb (4.5 kg), or the limit that you are told.  Return   to your normal activities when your doctor says that it is safe.  Keep all follow-up visits. Contact a doctor if:  You have a fever or chills.  You feel like you may vomit and you vomit. Get help right away if:  Your pain gets worse or is not helped by medicine.  You feel dizzy or weak.  You feel light-headed.  You faint.  You have sudden and very bad pain in your belly.  You have very bad pain in your shoulder or neck. Summary  An ectopic pregnancy happens when a fertilized egg grows outside the womb.  This is an emergency.  The most common cause is damage to one of the fallopian tubes.  This condition may be treated with medicine,  surgery, blood tests, or careful watching. This information is not intended to replace advice given to you by your health care provider. Make sure you discuss any questions you have with your health care provider. Document Revised: 12/04/2019 Document Reviewed: 11/24/2019 Elsevier Patient Education  2021 Elsevier Inc.  

## 2020-11-18 NOTE — MAU Note (Signed)
Robin Hall is a 24 y.o. at [redacted]w[redacted]d here in MAU reporting: here for follow up hcg. States bleeding has stopped but is now having back pain.   Pain score: 6/10  Vitals:   11/18/20 1857  BP: 106/68  Pulse: 88  Resp: 16  Temp: 98.6 F (37 C)  SpO2: 98%     Lab orders placed from triage: hcg

## 2020-11-18 NOTE — [facility] Provider Note (Addendum)
Chief Complaint: Follow-up   None     SUBJECTIVE HPI: Robin Hall is a 25 y.o. G1P0 at [redacted]w[redacted]d by LMP who presents to maternity admissions for repeat hcg. She initially presented on 11/14/20 with light bleeding.  She had inappropriate rise in hcg from 199 on 11/14/20 to 253 on 11/16/20.  No IUP or ectopic visualized on [place] on 11/14/20.  Pt denies any pain or bleeding today.     HPI  Past Medical History:  Diagnosis Date  . Herpes simplex    Past Surgical History:  Procedure Laterality Date  . NO PAST SURGERIES     Social History   Socioeconomic History  . Marital status: Single    Spouse name: Not on file  . Number of children: Not on file  . Years of education: Not on file  . Highest education level: Not on file  Occupational History  . Not on file  Tobacco Use  . Smoking status: Never Smoker  . Smokeless tobacco: Never Used  Vaping Use  . Vaping Use: Never used  Substance and Sexual Activity  . Alcohol use: Not Currently  . Drug use: Yes    Types: Marijuana    Comment: Last smoked 11/13/2020  . Sexual activity: Yes  Other Topics Concern  . Not on file  Social History Narrative  . Not on file   Social Determinants of Health   Financial Resource Strain: Not on file  Food Insecurity: Not on file  Transportation Needs: Not on file  Physical Activity: Not on file  Stress: Not on file  Social Connections: Not on file  Intimate Partner Violence: Not on file   No current facility-administered medications on file prior to encounter.   Current Outpatient Medications on File Prior to Encounter  Medication Sig Dispense Refill  . valACYclovir (VALTREX) 1000 MG tablet Take 1,000 mg by mouth as needed. (Patient not taking: Reported on 11/16/2020)     No Known Allergies  ROS:  Review of Systems  Constitutional: Negative for chills and fever.  Respiratory: Negative for cough and shortness of breath.   Cardiovascular: Negative for chest pain.  Gastrointestinal: Negative for  abdominal pain, nausea and vomiting.  Genitourinary: Negative for dysuria, frequency, urgency and vaginal bleeding.  Musculoskeletal: Negative.   Neurological: Negative for dizziness and headaches.     I have reviewed patient's Past Medical Hx, Surgical Hx, Family Hx, Social Hx, medications and allergies.   Physical Exam   Patient Vitals for the past 24 hrs:  BP Temp Temp src Pulse Resp SpO2  11/18/20 1857 106/68 98.6 F (37 C) Oral 88 16 98 %   Constitutional: Well-developed, well-nourished female in no acute distress.  Cardiovascular: normal rate Respiratory: normal effort GI: Abd soft, non-tender. Pos BS x 4 MS: Extremities nontender, no edema, normal ROM Neurologic: Alert and oriented x 4.  GU: Neg CVAT.  PELVIC EXAM: Deferred   LAB RESULTS Results for orders placed or performed during the hospital encounter of 11/18/20 (from the past 24 hour(s))  hCG, quantitative, pregnancy     Status: Abnormal   Collection Time: 11/18/20  6:54 PM  Result Value Ref Range   hCG, Beta Chain, Quant, S 388 (H) <5 mIU/mL  Comprehensive metabolic panel     Status: None   Collection Time: 11/18/20  7:15 PM  Result Value Ref Range   Sodium 139 135 - 145 mmol/L   Potassium 4.0 3.5 - 5.1 mmol/L   Chloride 107 98 - 111 mmol/L  CO2 27 22 - 32 mmol/L   Glucose, Bld 89 70 - 99 mg/dL   BUN 8 6 - 20 mg/dL   Creatinine, Ser 8.97 0.44 - 1.00 mg/dL   Calcium 9.3 8.9 - 84.7 mg/dL   Total Protein 6.6 6.5 - 8.1 g/dL   Albumin 3.8 3.5 - 5.0 g/dL   AST 15 15 - 41 U/L   ALT 13 0 - 44 U/L   Alkaline Phosphatase 42 38 - 126 U/L   Total Bilirubin 0.8 0.3 - 1.2 mg/dL   GFR, Estimated >84 >12 mL/min   Anion gap 5 5 - 15    --/--/A NEG (03/23 1944)  IMAGING US OB LESS THAN 14 WEEKS WITH OB TRANSVAGINAL  Result Date: 11/14/2020 CLINICAL DATA:  Vaginal bleeding affecting first trimester pregnancy; LMP 09/27/2020; no quantitative beta HCG available for correlation EXAM: OBSTETRIC <14 WK [place] AND  TRANSVAGINAL OB US TECHNIQUE: Both transabdominal and transvaginal ultrasound examinations were performed for complete evaluation of the gestation as well as the maternal uterus, adnexal regions, and pelvic cul-de-sac. Transvaginal technique was performed to assess early pregnancy. COMPARISON:  None FINDINGS: Intrauterine gestational sac: Not identified Yolk sac:  N/A Embryo:  N/A Cardiac Activity: N/A Heart Rate: N/A  bpm MSD:   mm    w     d CRL:    mm    w    d                  [place] EDC: Subchorionic hemorrhage:  N/A Maternal uterus/adnexae: Uterus anteverted, normal appearance. No evidence of uterine mass or gestational sac. Heterogeneous appearance of tissue along the endocervical canal likely prominent endocervical glandular tissue. Endometrial complex normal appearance 3 mm thick. RIGHT ovary normal size and morphology 2.7 x 1.9 x 1.4 cm. LEFT ovary normal size and morphology 1.7 x 1.3 x 3.0 cm. No free pelvic fluid or adnexal masses. IMPRESSION: No intrauterine gestation identified. Findings are consistent with pregnancy of unknown location. Differential diagnosis includes early intrauterine pregnancy too early to visualize, spontaneous abortion, and ectopic pregnancy. Serial quantitative beta HCG and or follow-up ultrasound recommended to definitively exclude ectopic pregnancy. Electronically Signed   By: Ulyses Southward M.D.   On: 11/14/2020 14:04    MAU Management/MDM: Orders Placed This Encounter  Procedures  . hCG, quantitative, pregnancy  . Comprehensive metabolic panel    No orders of the defined types were placed in this encounter.   Hcg lab pending. Report to Judeth Horn, NP.    Sharen Counter Certified Nurse-Midwife 11/18/2020  7:40 PM   Continued inappropriate rise in HCGs [phone]). C/w Dr. Shawnie Pons who recommends MTX. Discussed results with patient. This is a desired pregnancy & pt continues to hold out hope that pregnancy will continue. She has no pain or bleeding.  Discussed risks of ectopic pregnancy including emergent surgery, salpingectomy, or death. MTX would terminate pregnancy that we believe is likely not a normal pregnancy. Patient would like 1 more hormone check on Monday - is agreeable to mtx on Monday if HCG continues to be abnormal. Discussed s/s of ectopic pregnancy & reasons to return to [facility]U - she reports she lives close by & will come back immediately for concerning symptoms.       A/P 1. Pregnancy of unknown anatomic location    -likely ectopic pregnancy based on continued abnormal rise in HCGs. Pt declines MTX tonight & will return for HCG recheck on Monday. Will have her return to [facility]U due to probability  of needing MTX injection -strict ectopic return precautions  Judeth Horn, NP

## 2020-11-18 NOTE — Progress Notes (Signed)
Judeth Horn NP in earlier to discuss test results and d/c plan with pt. Written and verbal d/c instructions given and understanding voiced.

## 2020-11-22 ENCOUNTER — Inpatient Hospital Stay (HOSPITAL_COMMUNITY)
Admission: AD | Admit: 2020-11-22 | Discharge: 2020-11-22 | Disposition: A | Discharge status: Home / Self Care | Payer: Medicaid Other | Attending: Obstetrics & Gynecology | Admitting: Obstetrics & Gynecology

## 2020-11-22 ENCOUNTER — Other Ambulatory Visit: Payer: Self-pay

## 2020-11-22 ENCOUNTER — Inpatient Hospital Stay (EMERGENCY_DEPARTMENT_HOSPITAL)
Admission: AD | Admit: 2020-11-22 | Discharge: 2020-11-22 | Disposition: A | Discharge status: Home / Self Care | Payer: Medicaid Other | Source: Home / Self Care | Attending: Obstetrics and Gynecology | Admitting: Obstetrics and Gynecology

## 2020-11-22 DIAGNOSIS — Z3A00 Weeks of gestation of pregnancy not specified: Secondary | ICD-10-CM | POA: Insufficient documentation

## 2020-11-22 DIAGNOSIS — O26899 Other specified pregnancy related conditions, unspecified trimester: Secondary | ICD-10-CM | POA: Insufficient documentation

## 2020-11-22 DIAGNOSIS — O009 Unspecified ectopic pregnancy without intrauterine pregnancy: Secondary | ICD-10-CM

## 2020-11-22 DIAGNOSIS — Z6791 Unspecified blood type, Rh negative: Secondary | ICD-10-CM | POA: Insufficient documentation

## 2020-11-22 DIAGNOSIS — Z5329 Procedure and treatment not carried out because of patient's decision for other reasons: Secondary | ICD-10-CM

## 2020-11-22 DIAGNOSIS — Z5321 Procedure and treatment not carried out due to patient leaving prior to being seen by health care provider: Secondary | ICD-10-CM | POA: Diagnosis not present

## 2020-11-22 LAB — COMPREHENSIVE METABOLIC PANEL
ALT: 14 U/L (ref 0–44)
AST: 14 U/L — ABNORMAL LOW (ref 15–41)
Albumin: 3.8 g/dL (ref 3.5–5.0)
Alkaline Phosphatase: 38 U/L (ref 38–126)
Anion gap: 5 (ref 5–15)
BUN: 8 mg/dL (ref 6–20)
CO2: 27 mmol/L (ref 22–32)
Calcium: 9.3 mg/dL (ref 8.9–10.3)
Chloride: 106 mmol/L (ref 98–111)
Creatinine, Ser: 0.63 mg/dL (ref 0.44–1.00)
GFR, Estimated: >60 mL/min (ref >60)
Glucose, Bld: 96 mg/dL (ref 70–99)
Potassium: 4.1 mmol/L (ref 3.5–5.1)
Sodium: 138 mmol/L (ref 135–145)
Total Bilirubin: 0.8 mg/dL (ref 0.3–1.2)
Total Protein: 6.7 g/dL (ref 6.5–8.1)

## 2020-11-22 LAB — CBC WITH DIFFERENTIAL/PLATELET
Abs Immature Granulocytes: 0.01 10*3/uL (ref 0.00–0.07)
Basophils Absolute: 0 10*3/uL (ref 0.0–0.1)
Basophils Relative: 1 %
Eosinophils Absolute: 0.1 10*3/uL (ref 0.0–0.5)
Eosinophils Relative: 1 %
HCT: 42.7 % (ref 36.0–46.0)
Hemoglobin: 14.7 g/dL (ref 12.0–15.0)
Immature Granulocytes: 0 %
Lymphocytes Relative: 48 %
Lymphs Abs: 3.4 10*3/uL (ref 0.7–4.0)
MCH: 31.1 pg (ref 26.0–34.0)
MCHC: 34.4 g/dL (ref 30.0–36.0)
MCV: 90.3 fL (ref 80.0–100.0)
Monocytes Absolute: 0.6 10*3/uL (ref 0.1–1.0)
Monocytes Relative: 9 %
Neutro Abs: 2.8 10*3/uL (ref 1.7–7.7)
Neutrophils Relative %: 41 %
Platelets: 348 10*3/uL (ref 150–400)
RBC: 4.73 MIL/uL (ref 3.87–5.11)
RDW: 13 % (ref 11.5–15.5)
WBC: 6.9 10*3/uL (ref 4.0–10.5)
nRBC: 0 % (ref 0.0–0.2)

## 2020-11-22 LAB — HCG, QUANTITATIVE, PREGNANCY: hCG, Beta Chain, Quant, S: 584 m[IU]/mL — ABNORMAL HIGH (ref <5)

## 2020-11-22 MED ORDER — METHOTREXATE FOR ECTOPIC PREGNANCY
50.0000 mg/m2 | Freq: Once | INTRAMUSCULAR | Status: DC
Start: 2020-11-22 — End: 2020-11-22

## 2020-11-22 MED ORDER — METHOTREXATE SODIUM CHEMO INJECTION 50 MG/2ML
50.0000 mg/m2 | Freq: Once | INTRAMUSCULAR | Status: DC
  Filled 2020-11-22: qty 3.2

## 2020-11-22 MED ORDER — METHOTREXATE SODIUM CHEMO INJECTION 50 MG/2ML
50.0000 mg/m2 | Freq: Once | INTRAMUSCULAR | Status: AC
  Administered 2020-11-22: 80 mg via INTRAMUSCULAR
  Filled 2020-11-22: qty 3.2

## 2020-11-22 MED ORDER — METHOTREXATE FOR ECTOPIC PREGNANCY: 50.0000 mg/m2 | Freq: Once | INTRAMUSCULAR | Status: DC

## 2020-11-22 NOTE — Discharge Instructions (Signed)
Ectopic Pregnancy  An ectopic pregnancy happens when a fertilized egg attaches (implants) outside the uterus. In a normal pregnancy, a fertilized egg implants in the uterus. An ectopic pregnancy cannot develop into a healthy baby. Most ectopic pregnancies occur in one of the fallopian tubes, which is where an egg travels from an ovary to get to the uterus. This is called a tubal pregnancy. An ectopic pregnancy can also happen on an ovary, on the cervix, or in the abdomen. When a fertilized egg implants on tissue outside the uterus and begins to grow, it may cause the tissue to tear or burst. This is known as a ruptured ectopic pregnancy. The tear or burst causes internal bleeding. This may cause intense pain in the abdomen. An ectopic pregnancy is a medical emergency and can be life-threatening. What are the causes? The most common cause of this condition is damage to one of the fallopian tubes. A fallopian tube may be narrowed or blocked, and that stops the fertilized egg from reaching the uterus. Sometimes, the cause of this condition is not known. What increases the risk? The following factors may make you more likely to develop this condition:  Having gone through infertility treatment before.  Having had an ectopic pregnancy before.  Having had surgery to have the fallopian tubes tied.  Becoming pregnant while using an intrauterine device for birth control.  Taking birth control pills before the age of 16. Other risk factors include:  Smoking.  Alcohol use.  History of DES exposure. DES is a medicine that was used until 1971 and affected babies whose mothers took the medicine. What are the signs or symptoms? Common symptoms of this condition include:  Missing a menstrual period.  Nausea or tiredness.  Tender breasts.  Other normal pregnancy symptoms. Other symptoms may include:  Pain during sex.  Vaginal bleeding or spotting.  Cramping or pain in the lower abdomen.  A  fast heartbeat, low blood pressure, and sweating.  Pain or increased pressure while having a bowel movement. Symptoms of a ruptured ectopic pregnancy and internal bleeding may include:  Sudden, severe pain in the abdomen.  Dizziness, weakness, feeling light-headed, or fainting.  Pain in the shoulder or neck area. How is this diagnosed? This condition is diagnosed by:  A blood test to check for the pregnancy hormone.  A pelvic exam to find painful areas or a mass in the abdomen.  Ultrasound. A probe is inserted into the vagina to see if there is a pregnancy in or outside the uterus.  Taking a sample of tissue from the uterus.  Surgery to look closely at the fallopian tubes through an incision in the abdomen. How is this treated? This condition is usually treated with medicine or surgery. Sometimes, ectopic pregnancies can resolve on their own, under close monitoring by your health care provider. Medicine A medicine called methotrexate may be given to cause the pregnancy tissue to be absorbed. The medicine may be given if:  The diagnosis is made early, with no signs of active bleeding.  The fallopian tube has not torn or burst. You will need blood tests to make sure the medicine is working. It may take 4-6 weeks for the pregnancy tissues to be absorbed. Surgery Surgery may be performed to:  Remove the pregnancy tissue.  Stop internal bleeding.  Remove part or all of the fallopian tube.  Remove the uterus. This is rare. After surgery, you may need to have blood tests to make sure the surgery worked.   Follow these instructions at home: Medicines  Take over-the-counter and prescription medicines only as told by your health care provider.  Ask your health care provider if the medicine prescribed to you: ? Requires you to avoid driving or using machinery. ? Can cause constipation. You may need to take these actions to prevent or treat constipation:  Drink enough fluid to  keep your urine pale yellow.  Take over-the-counter or prescription medicines.  Eat foods that are high in fiber, such as beans, whole grains, and fresh fruits and vegetables.  Limit foods that are high in fat and processed sugars, such as fried or sweet foods. General instructions  Rest or limit your activity, if told by your health care provider.  Do not have sex or put anything in your vagina, such as tampons or douches, for 6 weeks or until your health care provider says it is safe.  Do not lift anything that is heavier than 10 lb (4.5 kg), or the limit that you are told, until your health care provider says that it is safe.  Return to your normal activities as told by your health care provider. Ask your health care provider what activities are safe for you.  Keep all follow-up visits. This is important. Contact a health care provider if:  You have a fever or chills.  You have nausea and vomiting. Get help right away if:  Your pain gets worse or is not relieved by medicine.  You feel dizzy or weak.  You feel light-headed or you faint.  You have sudden, severe pain in your abdomen.  You have sudden pain in the shoulder or neck area. Summary  An ectopic pregnancy happens when a fertilized egg implants outside the uterus. Most ectopic pregnancies occur in one of the fallopian tubes.  An ectopic pregnancy is a medical emergency and can be life-threatening.  The most common cause of this condition is damage to one of the fallopian tubes.  This condition is usually treated with medicine or surgery. Some ectopic pregnancies resolve on their own, under close monitoring by your health care provider. This information is not intended to replace advice given to you by your health care provider. Make sure you discuss any questions you have with your health care provider. Document Revised: 11/24/2019 Document Reviewed: 11/24/2019 Elsevier Patient Education  2021 [employer]  Inc.         Ruptured Ectopic Pregnancy  An ectopic pregnancy happens when a fertilized egg attaches (implants) outside the uterus, usually in one of the fallopian tubes. This is where an egg travels from an ovary to get to the uterus. An ectopic pregnancy cannot develop into a healthy baby. When a fertilized egg implants on tissue outside the uterus and begins to grow, it may cause the tissue to tear or burst. This is known as a ruptured ectopic pregnancy. The tear or burst causes internal bleeding. This may cause intense pain in the abdomen. A ruptured ectopic pregnancy can affect the ability to have children (fertility), depending on damage it causes to the reproductive organs. A ruptured ectopic pregnancy is a medical emergency. If not treated right away, it can lead to blood loss or shock, and it can be life-threatening. What are the causes? An ectopic pregnancy ruptures because it is growing in a spot that is not meant to expand and support the growth of a pregnancy. What increases the risk? You are more likely to have a ruptured ectopic pregnancy if:  You have an ectopic pregnancy,  but you do not have any symptoms and the pregnancy is not found early enough to treat it before it ruptures.  You have nonsurgical treatment of an ectopic pregnancy.  You choose not to have any treatment for an ectopic pregnancy. What are the signs or symptoms? Symptoms of a ruptured ectopic pregnancy and internal bleeding may include:  Sudden, severe pain in the abdomen.  Feeling dizzy, weak, or light-headed.  Fainting.  Pain in the shoulder or neck area. How is this diagnosed? This condition is diagnosed based on your medical history, symptoms, a physical exam, and testing. Testing may include an ultrasound and blood tests. How is this treated? This condition is treated with IV fluids and emergency surgery to remove the ectopic pregnancy and repair the area where the rupture occurred. If a  lot of blood was lost, donated blood may be needed (blood transfusion). You may receive a Rho (D) immune globulin shot if you are Rh negative and your baby's father is Rh positive, or if the Rh type of the father is unknown. This shot is given to prevent Rh problems in future pregnancies. You may receive other medicines. Summary  An ectopic pregnancy happens when a fertilized egg attaches (implants) outside the uterus, usually in one of the fallopian tubes. When a fertilized egg implants on tissue outside the uterus and begins to grow, it may cause the tissue to tear or burst. This is known as a ruptured ectopic pregnancy.  A ruptured ectopic pregnancy is a medical emergency. If not treated right away, it can lead to blood loss or shock, and it can be life-threatening.  This condition is treated with IV fluids and emergency surgery to remove the ectopic pregnancy and repair the area where the rupture occurred. If a lot of blood was lost, donated blood may be needed. This information is not intended to replace advice given to you by your health care provider. Make sure you discuss any questions you have with your health care provider. Document Revised: 11/24/2019 Document Reviewed: 11/24/2019 Elsevier Patient Education  2021 Elsevier Inc.         Methotrexate Treatment for an Ectopic Pregnancy Methotrexate is a medicine that treats an ectopic pregnancy. In this type of pregnancy, the fertilized egg attaches (implants) outside the uterus. An ectopic pregnancy cannot develop into a healthy baby. Methotrexate works by stopping the growth of the fertilized egg. It also helps the body absorb tissue from the egg. This takes about 2-6 weeks. An ectopic pregnancy can be life-threatening. However, most ectopic pregnancies can be successfully treated with methotrexate if they are diagnosed early. Tell a health care provider about:  Any allergies you have.  All medicines you are taking, including  vitamins, herbs, eye drops, creams, and over-the-counter medicines.  Any medical conditions you have. What are the risks? Generally, this is a safe treatment. However, problems may occur, including:  Digestive problems. You may have: ? Nausea. ? Vomiting. ? Diarrhea. ? Cramping in your abdomen.  Bleeding or spotting from your vagina.  Feeling dizzy or light-headed.  Mouth sores.  Inflammation of the lining of your lungs (pneumonitis).  Damage to nearby structures or organs, such as damage to the liver.  Hair loss. There is a risk that methotrexate treatment will fail and the pregnancy will continue. There is also a risk that the ectopic pregnancy might tear or burst (rupture) during use of this medicine. What happens before the procedure?  Blood tests will be done to check how your  disease-fighting system (immune system), liver, and kidneys are working.  You will also have blood tests to measure your pregnancy hormone levels and to find out your blood type.  You will be given a shot of a medicine called Rho(D) immune globulin if: ? You are Rh-negative and the father is Rh-positive. ? You are Rh-negative and the father's Rh type is unknown. What happens during the procedure?  Methotrexate will be injected into your muscle. ? Methotrexate may be given as a single dose of medicine or a series of doses over time, depending on your response to the treatment. ? Methotrexate injections are given by a health care provider. Injection is the most common way that this medicine is used to treat an ectopic pregnancy.  You may also receive other medicines to manage your ectopic pregnancy. The procedure may vary among health care providers and hospitals. What can I expect after treatment? After your treatment, it is common to have:  Cramping in your abdomen.  Bleeding in your vagina.  Tiredness (fatigue).  Nausea.  Vomiting.  Diarrhea. Blood tests will be done at timed  intervals for several days or weeks to check your pregnancy hormone levels. The blood tests will be done until the pregnancy hormone can no longer be found in the blood. If the methotrexate treatment does not work, a surgical procedure may be done to remove the ectopic pregnancy. Follow these instructions at home: Medicines  Take over-the-counter and prescription medicines only as told by your health care provider.  Do not take prescription pain medicines, aspirin, ibuprofen, naproxen, or any other NSAIDs.  Do not take folic acid, prenatal vitamins, or other vitamins that contain folic acid. Activity  Do not have sex, douche, or put anything, such as tampons, in your vagina until your health care provider says it is okay.  Limit activities that take a lot of effort as told by your health care provider. General instructions  Do not drink alcohol.  Follow instructions from your health care provider about eating restrictions, such as avoiding foods that produce a lot of gas. These foods can hide the signs of a ruptured ectopic pregnancy.  Limit exposure to sunlight or artificial UV light such as from tanning beds. Methotrexate can make you more sensitive to the sun.  Follow instructions from your health care provider on how and when to report any symptoms that may indicate a ruptured ectopic pregnancy.  Keep all follow-up visits. This is important.   Contact a health care provider if:  You have persistent nausea and vomiting.  You have persistent diarrhea.  You are having a reaction to the medicine. This may include: ? Unusual fatigue. ? Skin rash. Get help right away if:  Pain in your abdomen or in the area between your hip bones (pelvic area) gets worse.  You have more bleeding from your vagina.  You feel light-headed or you faint.  You are short of breath.  Your heart rate increases.  You develop a cough.  You have chills or a fever. Summary  Methotrexate is a  medicine that treats an ectopic pregnancy. This type of pregnancy forms outside the uterus.  There is a risk that methotrexate treatment will fail and the pregnancy will continue. There is also a risk that the ectopic pregnancy might tear or burst during use of this medicine.  This medicine may be given in a single dose or a series of doses over time.  After your treatment, blood tests will be done at  timed intervals for several days or weeks to check your pregnancy hormone levels. The blood tests will be done until no more pregnancy hormone is found in the blood. This information is not intended to replace advice given to you by your health care provider. Make sure you discuss any questions you have with your health care provider. Document Revised: 01/27/2020 Document Reviewed: 01/27/2020 Elsevier Patient Education  2021 [employer].

## 2020-11-22 NOTE — [facility] Note (Signed)
Pt has decided she now wants the injection.  Denies any pain or bleeding.

## 2020-11-22 NOTE — MA[facility] Note (Signed)
When pt not in lobby, called her on her cell phone as she had mentioned wanting to leave and be called when results back.  Pt started asking questions about hormone level, plan ? [place].  Call forwarded to [facility] NP. Pt not returning today, not getting MTX today.

## 2020-11-22 NOTE — MAU Provider Note (Signed)
S Ms. Shanik Brookshire is a 25 y.o. G1P0 patient who presents to MAU again today for MTX injection. Patient has been followed with hCG since 11/14/2020 with abnormally rising hCG. Patient was seen earlier today and was going to be given MTX at that time, but patient left AMA before injection could be given. Patient RH negative and was given RhoGAM on 11/16/2020. Patient was previously offered MTX on 3/23 and 3/25 and declined.  Her previous Quantitative HCG values are: 3/21=199 3/23=253 3/25=388 3/28=584  Ultrasound on 3/21 showed no IUP or adnexal mass.   Patient denies any pain or bleeding at this time.  O BP (!) 97/51 (BP Location: Right Arm)   Pulse 100   Temp 99 F (37.2 C) (Oral)   Resp 16   Ht 5\' 1"  (1.549 m)   Wt 60.6 kg   LMP 09/27/2020   SpO2 99%   BMI 25.24 kg/m    Patient Vitals for the past 24 hrs:  BP Temp Temp src Pulse Resp SpO2 Height Weight  11/22/20 1829 (!) 97/51 99 F (37.2 C) Oral 100 16 99 % 5\' 1"  (1.549 m) 60.6 kg   Physical Exam Vitals and nursing note reviewed.  Constitutional:      General: She is not in acute distress.    Appearance: Normal appearance. She is not ill-appearing, toxic-appearing or diaphoretic.  HENT:     Head: Normocephalic and atraumatic.  Pulmonary:     Effort: Pulmonary effort is normal.  Neurological:     Mental Status: She is alert and oriented to person, place, and time.  Psychiatric:        Mood and Affect: Mood normal.        Behavior: Behavior normal.        Thought Content: Thought content normal.        Judgment: Judgment normal.    A Medical screening exam complete Ectopic pregnancy  The risks of methotrexate were reviewed including failure requiring repeat dosing or eventual surgery. She understands that methotrexate involves frequent return visits to monitor lab values and that she remains at risk of ectopic rupture until her beta is less than assay. ?The patient opts to proceed with methotrexate.  She has no  history of hepatic or renal dysfunction, has normal BUN/Cr/LFT's/platelets.  She is felt to be reliable for follow-up. Side effects of photosensitivity & GI upset were discussed.  She knows to avoid direct sunlight and abstain from alcohol, NSAIDs and sexual intercourse for two weeks. She was counseled to discontinue any MVI with folic acid. ?She understands to follow up on D4 (Friday 11/25/2020 at [facility]) and D7 (Monday 11/28/2020) for repeat BHCG and was given the instruction sheet. ?Strict ectopic precautions were reviewed, the patient knows to call with any abdominal pain, vomiting, fainting, or any concerns with her health.  Day 0/1 Day 4 Day 7  Sunday Wednesday Saturday  Monday Thursday Sunday  Tuesday Friday Monday  Wednesday Saturday Tuesday  Thursday Sunday Wednesday  Friday Monday Thursday  Saturday Tuesday Friday    Methotrexate Treatment Protocol for Ectopic Pregnancy  [x] Pretreatment testing and instructions  [x] hCG concentration (584) [x] Transvaginal ultrasound (3/21 - PUL) [x] Blood group and Rh(D) typing (A NEG, Rhogam given 3/23)  [x] Complete blood count (WNL) [x] Liver and renal function tests  (WNL) [x] Discontinue folic acid supplements  [x] Counsel patient to avoid NSAIDs, recommend acetaminophen if an analgesic is needed  [x] Advise patient to refrain from sexual intercourse and strenuous exercise  Treatment day  Single dose protocol  1  hCG.  Administer Methotrexate 50 mg/m2 body surface area IM  4  hCG  7  hCG  If <15 percent hCG decline from day 4 to 7, give additional dose of methotrexate 50 mg/m2 IM  If ?15 percent hCG decline from day 4 to 7, draw hCG weekly until undetectable  14  hCG  If <15 percent hCG decline from day 7 to 14, give additional dose of methotrexate 50 mg/m2 IM  If ?15 percent hCG decline from day 7 to 14, check hCG weekly until undetectable  21 and 28  If 3 doses have been given and there is a <15 percent hCG decline from day 21 to 28, proceed  with laparoscopic surgery  Laparoscopy  If severe abdominal pain or an acute abdomen suggestive of tubal rupture occurs If ultrasonography reveals greater than 300 mL pelvic or other intraperitoneal fluid  The hCG concentration usually declines to less than 15 mIU/mL by 35 days postinjection but may take as long as 109 days. If the hCG does not decline to zero, a new pregnancy should be excluded; if the hCG is rising, a transvaginal ultrasound should be performed. Alternatively, some patients have a slow clearance of serum hCG. If three weekly values are similar, consider an additional dose of MTX (50 mg/m2) not to exceed the recommended maximum of three total doses. This typically accelerates the decline of serum hCG. The risk of gestational trophoblastic disease is low. Folinic acid rescue is not required for women treated with the single-dose protocol, even if multiple doses are ultimately given.   Prepared with data from:   [facility]. Clinical practice. Ectopic pregnancy. Malva Limes Med 2009; 361:379  American College of Obstetricians and Gynecologists. ACOG Practice Bulletin No. 94: Medical management of ectopic pregnancy. Obstet Gynecol 2008; 709:6283.  P Discharge from MAU in stable condition Appt made for Day 4 and Day 7 hCG Warning signs for worsening condition that would warrant emergency follow-up discussed Patient may return to MAU as needed   Marlies Ligman, Odie Sera, NP 11/22/2020 7:46 PM

## 2020-11-22 NOTE — MAU Note (Addendum)
Bleeding stopped last week.  No pain.  Came in yesterday for medication, the person that came with her couldn't wait the 2 hrs, so she just came today to get the medication for ectopic.

## 2020-11-22 NOTE — MAU Provider Note (Addendum)
History   Chief Complaint:  Ectopic Pregnancy   Robin Hall is  25 y.o. G1P0 Patient's last menstrual period was 09/27/2020.Marland Kitchen Patient is here for follow up of quantitative HCG and ongoing surveillance of pregnancy status. She is [redacted]w[redacted]d weeks gestation  by LMP.   She was offered methotrexate for ectopic treatment on 3/23 & 3/25 due to suspected ectopic based on inappropriately changing HCGs. On 3/25, patient wanted to return yesterday for another HCG & then was willing to receive MTX injection but didn't come in due to parking issues.  She is RH negative & received rhogam on 3/23. Since her last visit, the patient is without new complaint. The patient reports bleeding as  none now.  She denies any pain.  General ROS:  negative  Her previous Quantitative HCG values are: 3/21=199 3/23=253 3/25=388  Ultrasound on 3/21 showed no IUP or adnexal mass.   Physical Exam   Blood pressure 104/67, pulse 85, temperature 98.2 F (36.8 C), temperature source Oral, resp. rate 16, height 5\' 1"  (1.549 m), weight 60.6 kg, last menstrual period 09/27/2020, SpO2 99 %.  Focused Gynecological Exam: examination not indicated  Physical Examination: General appearance - alert, well appearing, and in no distress Mental status - normal mood, behavior, speech, dress, motor activity, and thought processes Chest - normal effort, no respiratory distress Abdomen - soft, nontender, nondistended, no masses or organomegaly   Labs: Results for orders placed or performed during the hospital encounter of 11/22/20 (from the past 24 hour(s))  hCG, quantitative, pregnancy   Collection Time: 11/22/20 10:09 AM  Result Value Ref Range   hCG, Beta Chain, Quant, S 584 (H) <5 mIU/mL  CBC WITH DIFFERENTIAL   Collection Time: 11/22/20 10:09 AM  Result Value Ref Range   WBC 6.9 4.0 - 10.5 K/uL   RBC 4.73 3.87 - 5.11 MIL/uL   Hemoglobin 14.7 12.0 - 15.0 g/dL   HCT 11/24/20 96.2 - 22.9 %   MCV 90.3 80.0 - 100.0 fL   MCH 31.1  26.0 - 34.0 pg   MCHC 34.4 30.0 - 36.0 g/dL   RDW 79.8 92.1 - 19.4 %   Platelets 348 150 - 400 K/uL   nRBC 0.0 0.0 - 0.2 %   Neutrophils Relative % 41 %   Neutro Abs 2.8 1.7 - 7.7 K/uL   Lymphocytes Relative 48 %   Lymphs Abs 3.4 0.7 - 4.0 K/uL   Monocytes Relative 9 %   Monocytes Absolute 0.6 0.1 - 1.0 K/uL   Eosinophils Relative 1 %   Eosinophils Absolute 0.1 0.0 - 0.5 K/uL   Basophils Relative 1 %   Basophils Absolute 0.0 0.0 - 0.1 K/uL   Immature Granulocytes 0 %   Abs Immature Granulocytes 0.01 0.00 - 0.07 K/uL  Comprehensive metabolic panel   Collection Time: 11/22/20 10:09 AM  Result Value Ref Range   Sodium 138 135 - 145 mmol/L   Potassium 4.1 3.5 - 5.1 mmol/L   Chloride 106 98 - 111 mmol/L   CO2 27 22 - 32 mmol/L   Glucose, Bld 96 70 - 99 mg/dL   BUN 8 6 - 20 mg/dL   Creatinine, Ser 11/24/20 0.44 - 1.00 mg/dL   Calcium 9.3 8.9 - 0.81 mg/dL   Total Protein 6.7 6.5 - 8.1 g/dL   Albumin 3.8 3.5 - 5.0 g/dL   AST 14 (L) 15 - 41 U/L   ALT 14 0 - 44 U/L   Alkaline Phosphatase 38 38 - 126 U/L  Total Bilirubin 0.8 0.3 - 1.2 mg/dL   GFR, Estimated >86 >76 mL/min   Anion gap 5 5 - 15     Assessment:   1. Ectopic pregnancy, unspecified location, unspecified whether intrauterine pregnancy present   2. Left against medical advice     -Patient has been counseled regarding methotrexate administration for treatment of ectopic pregnancy twice since 3/23. Today she reports no abdominal pain or vaginal bleeding & was agreeable to receiving methotrexate.  Patient was asked to wait for results but left the hospital while labs were pending. Called patient regarding results. Patient still concerned whether pregnancy is normal & if she should receive injection. Discussed that HCG is continuing to change abnormally & we recommend treatment. She asked about repeating her ultrasound to assess for IUP - told her she could come in for an ultrasound but that I wouldn't expect an IUP with how low  her hormone level is but could see if there's an ectopic on ultrasound. Pt continued to report that she feels like she's getting the run around - per notes patient was counseled on 3/23, on 3/25 (by myself), and had an appropriate conversation with me in triage earlier today. Offered patient to return to MAU for an ultrasound and/or mtx injection. Pt undecided due to having to wait every time she's come to MAU- discussed that the medication was already here for her & the ultrasound would not take as long as the blood work - however, there is an expected wait time whenever someone presents to the hospital. Call ended - unsure if patient hung up.   Plan: Will leave MTX on unit today in case patient returns   Judeth Horn, NP 11/22/2020, 3:20 PM  11/23/20- Care reviewed- pt did in fact return to MAU and received MTX 11/22/20 evening with instructions for follow up. Attestation of Attending Supervision of Advanced Practice Provider (PA/CNM/NP): Evaluation and management procedures were performed by the Advanced Practice Provider under my supervision and collaboration.  I have reviewed the Advanced Practice Provider's note and chart, and I agree with the management and plan. I have also made any necessary editorial changes.   Sharon Seller, DO Attending Obstetrician & Gynecologist, [facility] for [facility], [facility] 11/23/2020 2:57 PM

## 2020-11-25 ENCOUNTER — Ambulatory Visit: Payer: Medicaid Other

## 2020-11-28 ENCOUNTER — Other Ambulatory Visit: Payer: Medicaid Other

## 2021-03-29 ENCOUNTER — Inpatient Hospital Stay (EMERGENCY_DEPARTMENT_HOSPITAL)
Admission: AD | Admit: 2021-03-29 | Discharge: 2021-03-29 | Disposition: A | Discharge status: Home / Self Care | Payer: Medicaid Other | Source: Home / Self Care | Attending: Family Medicine | Admitting: Family Medicine

## 2021-03-29 ENCOUNTER — Inpatient Hospital Stay (HOSPITAL_COMMUNITY)
Admission: AD | Admit: 2021-03-29 | Discharge: 2021-03-29 | Discharge status: Against Medical Advice | Payer: Medicaid Other | Attending: Obstetrics and Gynecology | Admitting: Obstetrics and Gynecology

## 2021-03-29 ENCOUNTER — Encounter (HOSPITAL_COMMUNITY): Payer: Self-pay | Admitting: Obstetrics and Gynecology

## 2021-03-29 ENCOUNTER — Other Ambulatory Visit: Payer: Self-pay

## 2021-03-29 DIAGNOSIS — O209 Hemorrhage in early pregnancy, unspecified: Secondary | ICD-10-CM | POA: Diagnosis present

## 2021-03-29 DIAGNOSIS — Z3A01 Less than 8 weeks gestation of pregnancy: Secondary | ICD-10-CM | POA: Insufficient documentation

## 2021-03-29 DIAGNOSIS — Z6711 Type A blood, Rh negative: Secondary | ICD-10-CM | POA: Diagnosis not present

## 2021-03-29 DIAGNOSIS — O039 Complete or unspecified spontaneous abortion without complication: Secondary | ICD-10-CM | POA: Insufficient documentation

## 2021-03-29 DIAGNOSIS — Z5329 Procedure and treatment not carried out because of patient's decision for other reasons: Secondary | ICD-10-CM | POA: Diagnosis not present

## 2021-03-29 DIAGNOSIS — Z3A00 Weeks of gestation of pregnancy not specified: Secondary | ICD-10-CM | POA: Insufficient documentation

## 2021-03-29 LAB — POCT PREGNANCY, URINE: Preg Test, Ur: NEGATIVE

## 2021-03-29 LAB — HCG, QUANTITATIVE, PREGNANCY: hCG, Beta Chain, Quant, S: 8 m[IU]/mL — ABNORMAL HIGH (ref <5)

## 2021-03-29 MED ORDER — RHO D IMMUNE GLOBULIN 1500 UNIT/2ML IJ SOSY
300.0000 ug | PREFILLED_SYRINGE | Freq: Once | INTRAMUSCULAR | Status: AC
  Administered 2021-03-29: 300 ug via INTRAMUSCULAR
  Filled 2021-03-29: qty 2

## 2021-03-29 NOTE — MAU Note (Signed)
Dr Mathis Fare in Triage to see pt and discuss plan of care. Pt will then be d/c from Triage.

## 2021-03-29 NOTE — Progress Notes (Signed)
Went to give pt her d/c papers in Family Rm but pt had already left. She did receive verbal dc instructions from Dr Mathis Fare before leaving

## 2021-03-29 NOTE — MAU Provider Note (Signed)
History    [id number]  Arrival date and time: 03/29/21 1352   Chief Complaint  Patient presents with   Vaginal Bleeding   HPI Carlea Badour is a 25 y.o. female who presents to MAU with concern for miscarriage. She reports having a positive home pregnancy test a few weeks ago. On 7/29, she started having bleeding with some clots. Patient reports that she had a positive pregnancy test on Monday 8/1 at [facility] but states it was a faint positive with a urine pregnancy test. Her bleeding is now light and similar to when her period is ending. She had some associated cramping initially but this has subsided.  --/--/A NEG (03/23 1944)  OB History     Gravida  2   Para      Term      Preterm      AB      Living         SAB      IAB      Ectopic      Multiple      Live Births              Past Medical History:  Diagnosis Date   Herpes simplex     Past Surgical History:  Procedure Laterality Date   NO PAST SURGERIES      Family History  Problem Relation Age of Onset   Diabetes Mother    Thyroid disease Father     Social History   Socioeconomic History   Marital status: Single    Spouse name: Not on file   Number of children: Not on file   Years of education: Not on file   Highest education level: Not on file  Occupational History   Not on file  Tobacco Use   Smoking status: Never   Smokeless tobacco: Never  Vaping Use   Vaping Use: Never used  Substance and Sexual Activity   Alcohol use: Not Currently   Drug use: Yes    Types: Marijuana    Comment: Last smoked 11/13/2020   Sexual activity: Yes  Other Topics Concern   Not on file  Social History Narrative   Not on file   Social Determinants of Health   Financial Resource Strain: Not on file  Food Insecurity: Not on file  Transportation Needs: Not on file  Physical Activity: Not on file  Stress: Not on file  Social Connections: Not on file  Intimate Partner Violence: Not  on file    No Known Allergies  No current facility-administered medications on file prior to encounter.   Current Outpatient Medications on File Prior to Encounter  Medication Sig Dispense Refill   valACYclovir (VALTREX) 1000 MG tablet Take 1,000 mg by mouth as needed.      ROS Pertinent positives and negative per HPI, all others reviewed and negative  Physical Exam   BP 107/60   Pulse 94   Temp 98.3 F (36.8 C)   Resp 18   Ht 5\' 1"  (1.549 m)   Wt 58.4 kg   LMP 02/03/2021   Breastfeeding Unknown   BMI 24.34 kg/m   Physical Exam Constitutional:      General: She is not in acute distress.    Appearance: Normal appearance.  HENT:     Head: Normocephalic and atraumatic.     Mouth/Throat:     Mouth: Mucous membranes are moist.  Eyes:     Conjunctiva/sclera: Conjunctivae normal.  Cardiovascular:  Rate and Rhythm: Normal rate and regular rhythm.  Pulmonary:     Effort: Pulmonary effort is normal. No respiratory distress.  Abdominal:     Palpations: Abdomen is soft.     Tenderness: There is no abdominal tenderness.  Musculoskeletal:     Right lower leg: No edema.     Left lower leg: No edema.  Skin:    General: Skin is warm and dry.  Neurological:     Mental Status: She is alert and oriented to person, place, and time.  Psychiatric:        Mood and Affect: Mood normal.        Behavior: Behavior normal.   Labs Results for orders placed or performed during the hospital encounter of 03/29/21 (from the past 24 hour(s))  Pregnancy, urine POC     Status: None   Collection Time: 03/29/21  2:34 PM  Result Value Ref Range   Preg Test, Ur NEGATIVE NEGATIVE  hCG, quantitative, pregnancy     Status: Abnormal   Collection Time: 03/29/21  2:42 PM  Result Value Ref Range   hCG, Beta Chain, Quant, S 8 (H) <5 mIU/mL  Rh IG workup (includes ABO/Rh)     Status: None   Collection Time: 03/29/21  4:20 PM  Result Value Ref Range   Gestational Age(Wks)      7 Performed at  [facility], [address], [place], [place] [address]     Imaging No results found.  MAU Course  Procedures Lab Orders  hCG, quantitative, pregnancy  Pregnancy, urine POC  No orders of the defined types were placed in this encounter.  Imaging Orders  No imaging studies ordered today   MDM Patient presents to MAU with concern for miscarraige. Pregnancy test on 8/1 reportedly positive. Urine pregnancy test here in MAU negative.   hCG quantitative ordered.   Blood type A neg - will need Rhogam if hCG is positive.  hCG elevated at 8. Rhogam workup ordered and plan for Rhogam administration.   Patient voiced to staff and provider that she has to leave to pick up her significant other from work. She is wanting to return afterwards to receive her Rhogam. Discussed that leaving at this time would be leaving against medical advice given ongoing workup for miscarriage and need for Rhogam given Rh negative status. Patient voiced understanding and would still like to leave AMA and return later for further management. AMA paperwork signed. Stressed importance of return for appropriate treatment and patient voiced understanding.  Assessment and Plan   1. Miscarriage -Patient left AMA -Rhogam workup ordered due to Rh negative status  -Emphasized importance of return for Rhogam administration -Patient voiced understanding and is agreeable to returning later today  Worthy Rancher, MD Obstetrics Fellow 03/29/21 5:01 PM

## 2021-03-29 NOTE — MAU Note (Signed)
Pt here for rhogam injection.

## 2021-03-29 NOTE — MAU Note (Signed)
New order for Rhophylac (Rhogam ). Explained to pt importance of getting  Rhogam. PPt agreeable to receiving it but needs to leave to pick up her boyfriend. State she would come back later this evening to receive medication. Notified Dr. Mathis Fare and she spoke with pt. Pt signed AMA and will return later this evening for med.

## 2021-03-29 NOTE — MAU Provider Note (Signed)
History    [id number]  Arrival date and time: 03/29/21 1833  Chief Complaint  Patient presents with   Miscarriage    HPI Robin Hall is a 25 y.o. at [redacted]w[redacted]d by LMP who presents to MAU for Rhogam injection in the setting of a miscarriage. Patient evaluated earlier today and found to have negative UPT with hCG of 8. Patient left AMA to attend to another activity and has now returned for treatment. She has no new concerns at this time. She continues to report that her bleeding has now resolved.  --/--/A NEG (08/03 1620)  OB History     Gravida  2   Para      Term      Preterm      AB      Living         SAB      IAB      Ectopic      Multiple      Live Births             Past Medical History:  Diagnosis Date   Herpes simplex     Past Surgical History:  Procedure Laterality Date   NO PAST SURGERIES      Family History  Problem Relation Age of Onset   Diabetes Mother    Thyroid disease Father     Social History   Socioeconomic History   Marital status: Single    Spouse name: Not on file   Number of children: Not on file   Years of education: Not on file   Highest education level: Not on file  Occupational History   Not on file  Tobacco Use   Smoking status: Never   Smokeless tobacco: Never  Vaping Use   Vaping Use: Never used  Substance and Sexual Activity   Alcohol use: Not Currently   Drug use: Yes    Types: Marijuana    Comment: Last smoked 11/13/2020   Sexual activity: Yes  Other Topics Concern   Not on file  Social History Narrative   Not on file   Social Determinants of Health   Financial Resource Strain: Not on file  Food Insecurity: Not on file  Transportation Needs: Not on file  Physical Activity: Not on file  Stress: Not on file  Social Connections: Not on file  Intimate Partner Violence: Not on file    No Known Allergies  No current facility-administered medications on file prior to encounter.   Current  Outpatient Medications on File Prior to Encounter  Medication Sig Dispense Refill   valACYclovir (VALTREX) 1000 MG tablet Take 1,000 mg by mouth as needed.      ROS Pertinent positives and negative per HPI, all others reviewed and negative  Physical Exam   LMP 02/03/2021   Physical Exam Constitutional:      General: She is not in acute distress.    Appearance: Normal appearance.  Pulmonary:     Effort: Pulmonary effort is normal. No respiratory distress.  Neurological:     Mental Status: She is alert and oriented to person, place, and time.  Psychiatric:        Mood and Affect: Mood normal.        Behavior: Behavior normal.   Labs Results for orders placed or performed during the hospital encounter of 03/29/21 (from the past 24 hour(s))  Pregnancy, urine POC     Status: None   Collection Time: 03/29/21  2:34 PM  Result Value  Ref Range   Preg Test, Ur NEGATIVE NEGATIVE  hCG, quantitative, pregnancy     Status: Abnormal   Collection Time: 03/29/21  2:42 PM  Result Value Ref Range   hCG, Beta Chain, Quant, S 8 (H) <5 mIU/mL  Rh IG workup (includes ABO/Rh)     Status: None (Preliminary result)   Collection Time: 03/29/21  4:20 PM  Result Value Ref Range   Gestational Age(Wks) 7    ABO/RH(D) A NEG    Antibody Screen NEG    Unit Number [id number]    Blood Component Type RHIG    Unit division 00    Status of Unit ISSUED    Transfusion Status      OK TO TRANSFUSE Performed at [facility], [address] [place], [place] [address]    Imaging No results found.  MAU Course  Procedures Lab Orders  No laboratory test(s) ordered today   Meds ordered this encounter  Medications   rho (d) immune globulin (RHIG/RHOPHYLAC) injection 300 mcg   Imaging Orders  No imaging studies ordered today   MDM UPT neg; hCG 8  A neg - Rh workup completed  Assessment and Plan   1. Miscarriage - Rhogam administered and tolerated without issue - Follow up hCG lab  appointment scheduled - Stable for discharge home - Return precautions reviewed  Evalina Field, MD OB Fellow, Faculty Practice [facility], [facility] 03/29/2021 7:30 PM

## 2021-03-30 LAB — RH IG WORKUP (INCLUDES ABO/RH)
ABO/RH(D): A NEG
Antibody Screen: NEGATIVE
Gestational Age(Wks): 7
Unit division: 0

## 2021-04-04 ENCOUNTER — Ambulatory Visit: Payer: Medicaid Other

## 2021-04-04 ENCOUNTER — Other Ambulatory Visit: Payer: Self-pay | Admitting: *Deleted

## 2021-04-04 ENCOUNTER — Other Ambulatory Visit: Payer: Self-pay

## 2021-04-04 DIAGNOSIS — O039 Complete or unspecified spontaneous abortion without complication: Secondary | ICD-10-CM

## 2021-04-05 LAB — BETA HCG QUANT (REF LAB): hCG Quant: <1 m[IU]/mL

## 2021-04-06 ENCOUNTER — Telehealth: Payer: Self-pay

## 2021-04-06 NOTE — Telephone Encounter (Signed)
-----   Message from Worthy Rancher, MD sent at 04/05/2021  6:36 PM EDT ----- Please let patient know that her hCG went back down to normal. No further follow up is needed. Thank you!   -Evalina Field

## 2021-04-06 NOTE — Telephone Encounter (Signed)
Attempted to call pt x 2. No answer and unable to leave VM. Then call placed to sister on DPR. Per pt, ok to relay information. Pt's name and DOB verified. Pt's phone is turned off per sister Lelo.  Gave results per Dr Mathis Fare. Pt advised to return call to office or send mychart message with further questions or concerns. Sister verbalized understanding and agreeable to plan of care.   Judeth Cornfield, RN

## 2021-08-30 IMAGING — CT CT ABD-PELV W/ CM
2 of 4 series · 16 of 46 positions shown, 18 images · IV contrast (APPLIED)
Comparison: None

CLINICAL DATA: Bilateral lower abdominal pain

EXAM:
CT ABDOMEN AND PELVIS WITH CONTRAST
TECHNIQUE: Multidetector CT imaging of the abdomen and pelvis was performed
using the standard protocol following bolus administration of
intravenous contrast.
CONTRAST:  100mL OMNIPAQUE IOHEXOL 300 MG/ML  SOLN

[Series 3: abd/ pelvis 5.0 i30f 2 · axial · 0.79mm/px · z∈[+934,+1314]mm · 13 of 84 slices shown, 15 images]
[im 4/84  soft-tissue]
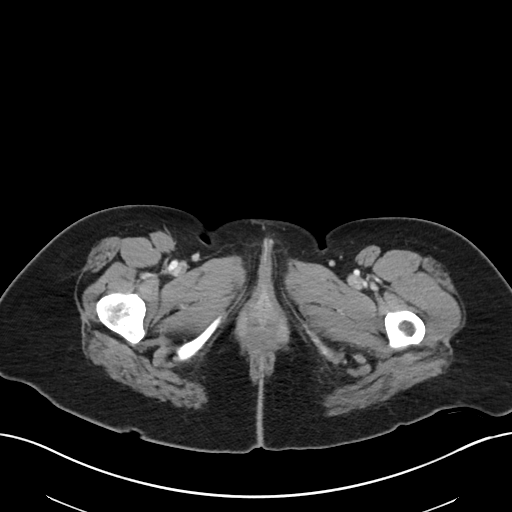
[im 4/84  bone]
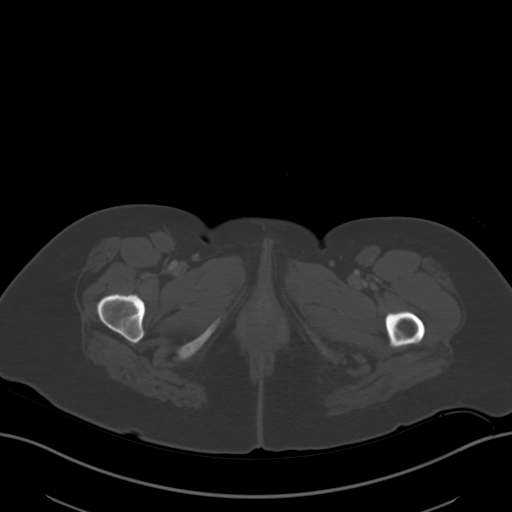
[im 11/84  soft-tissue]
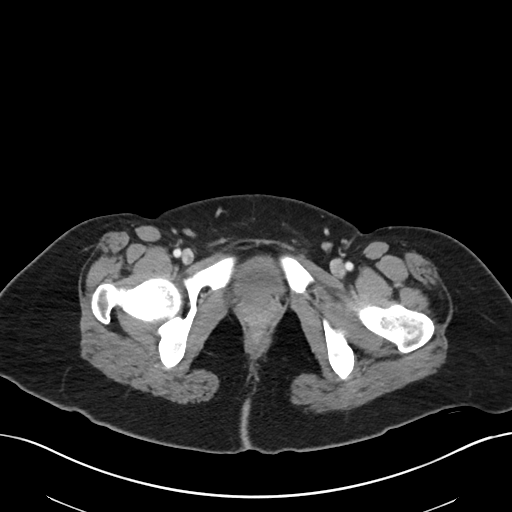
[im 18/84  soft-tissue]
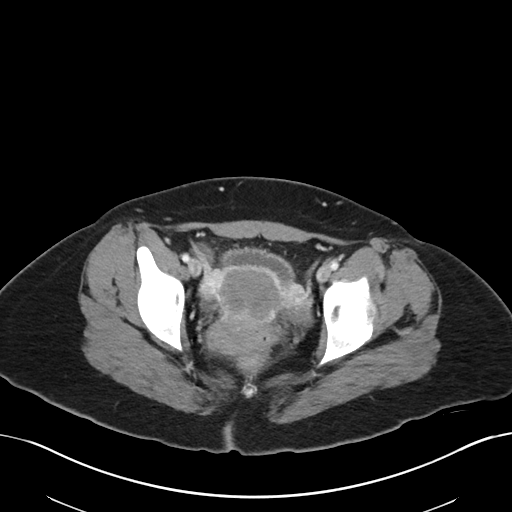
[im 25/84  soft-tissue]
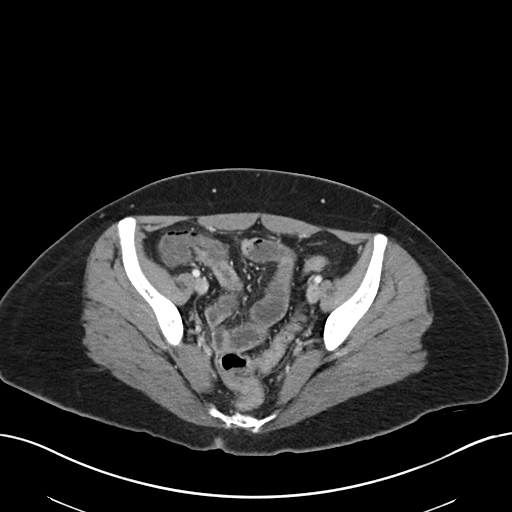
[im 28/84  soft-tissue]
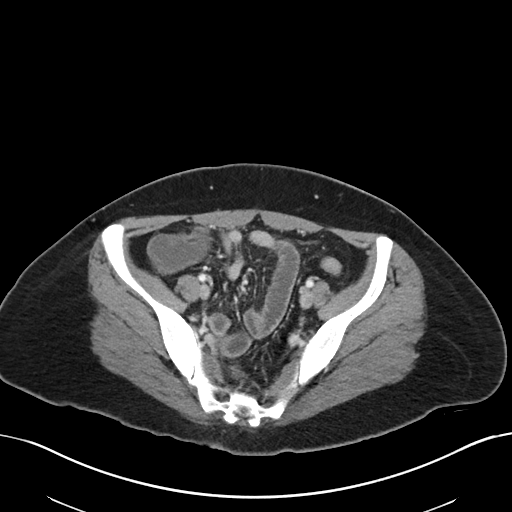
[im 35/84  soft-tissue]
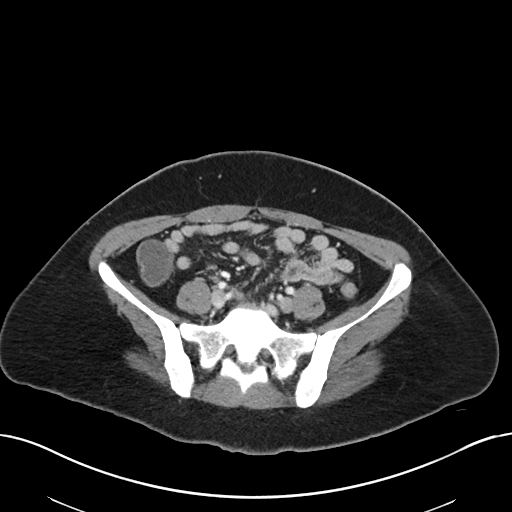
[im 42/84  soft-tissue]
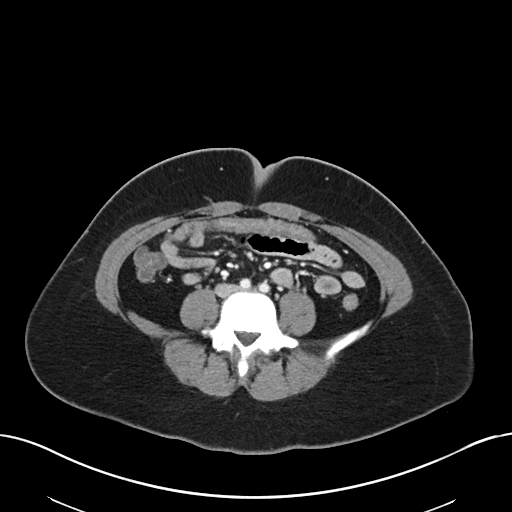
[im 49/84  soft-tissue]
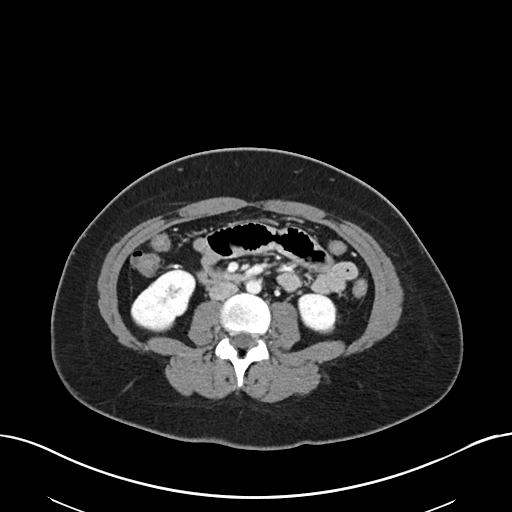
[im 56/84  soft-tissue]
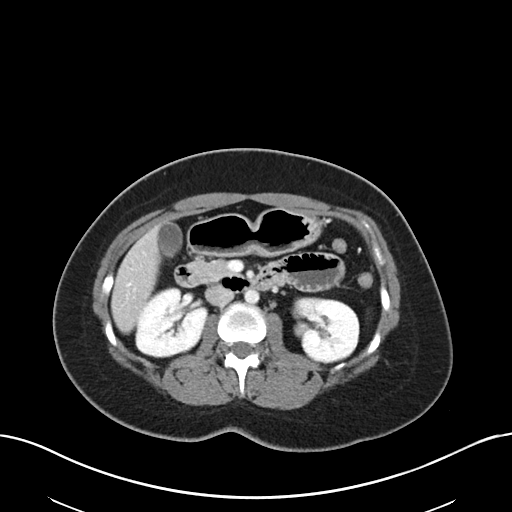
[im 56/84  bone]
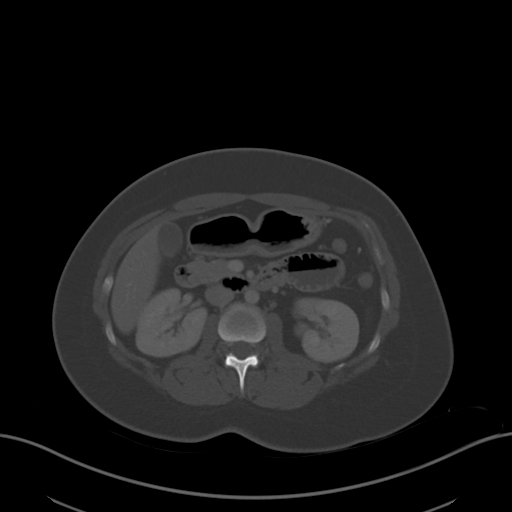
[im 59/84  soft-tissue]
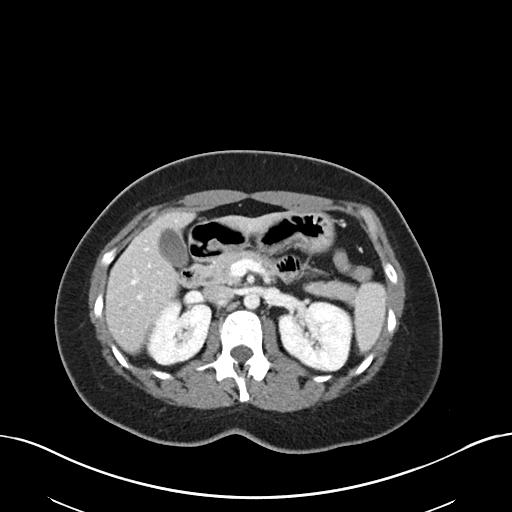
[im 66/84  soft-tissue]
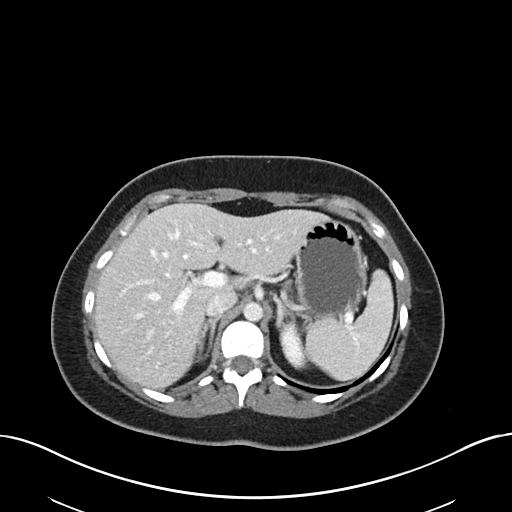
[im 73/84  soft-tissue]
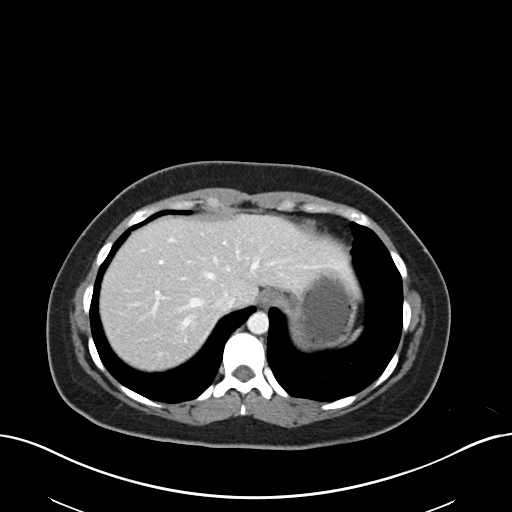
[im 80/84  soft-tissue]
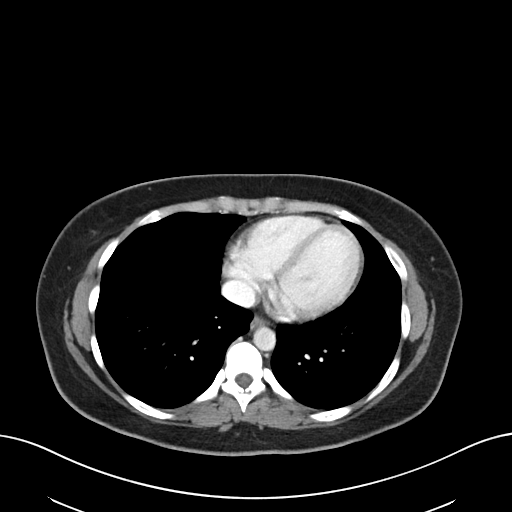

[Series 6: coronal soft tissue · coronal · 0.82mm/px · 3 of 87 slices shown]
[im 29/87  soft-tissue]
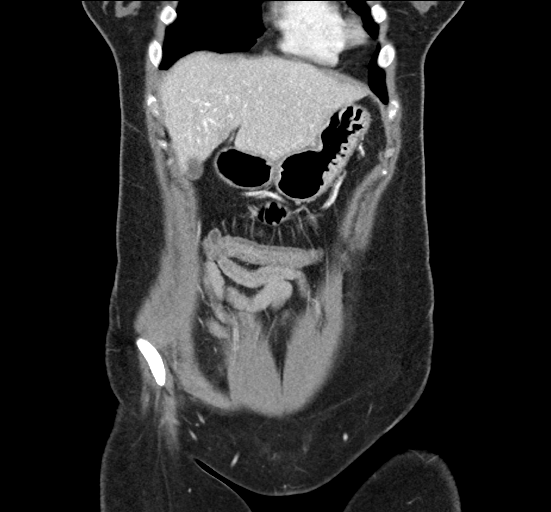
[im 39/87  soft-tissue]
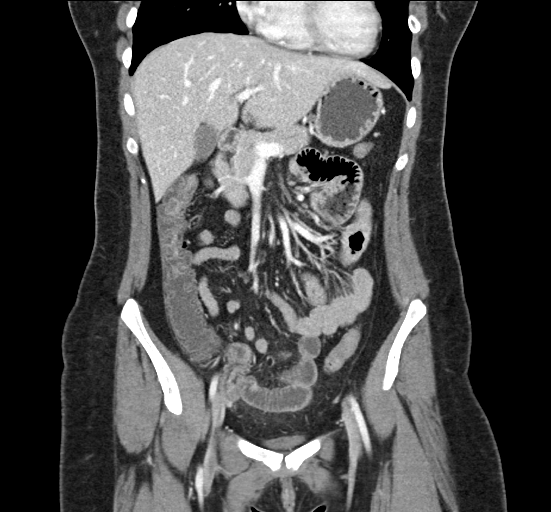
[im 48/87  soft-tissue]
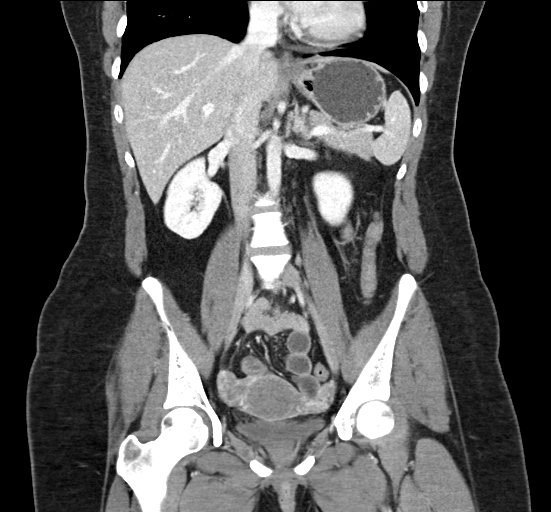

[16 of 46 positions shown; findings below may reference images not displayed]

FINDINGS: Lower chest: Lung bases are clear. Normal heart size. No pericardial
effusion.

Hepatobiliary: No worrisome focal liver abnormality is seen. Normal
gallbladder. No visible calcified gallstones. No biliary ductal
dilatation.

Pancreas: Unremarkable. No pancreatic ductal dilatation or
surrounding inflammatory changes.

Spleen: Normal in size without focal abnormality.

Adrenals/Urinary Tract: Normal adrenal glands. Kidneys are normally
located with symmetric enhancement. No suspicious renal lesion,
urolithiasis or hydronephrosis. Urinary bladder is largely
decompressed at the time of exam and therefore poorly evaluated by
CT imaging. Mild wall thickening likely related to underdistention
though could correlate for urinary symptoms.

Stomach/Bowel: Distal esophagus, stomach and duodenum are
unremarkable. No proximal small thickening or dilatation. Distal
ileum is diffusely fluid-filled with mild hyperemic mural thickening
increased vascularity of the adjacent mesentery. A fluid-filled
appearance of the colon is noted proximally with more diffuse
pancolonic edematous mural thickening and pericolonic haze. No
evidence of high-grade bowel obstruction. Thickening towards the
base of the appendix is favored to be reactive without focal
periappendiceal stranding or thickening.

Vascular/Lymphatic: No significant vascular findings are present. No
enlarged abdominal or pelvic lymph nodes.

Reproductive: Anteverted uterus. Heterogeneous uterine enhancement
is likely related to contrast timing. Anteverted uterus. No
concerning adnexal lesions.

Other: No abdominopelvic free air or fluid. No bowel containing
hernias.

Musculoskeletal: No acute osseous abnormality or suspicious osseous
lesion. Abrupt angulation of the coccyx without transcortical
lucency to suggest an acute injury.
IMPRESSION: 1. Diffusely fluid-filled appearance of the distal ileum, colon and
rectum with more diffuse pancolonic edematous mural thickening and
pericolonic haze, consistent with enterocolitis, which may be
infectious or inflammatory in etiology. No evidence of high-grade
bowel obstruction.
2. Thickening towards the base of the appendix is favored to be
reactive without focal periappendiceal stranding or thickening.
3. Mild bladder wall thickening likely underdistention though could
correlate for urinary symptoms.

## 2022-02-13 ENCOUNTER — Ambulatory Visit
Admission: EM | Admit: 2022-02-13 | Discharge: 2022-02-13 | Disposition: A | Discharge status: Home / Self Care | Payer: Medicaid Other | Attending: Family Medicine | Admitting: Family Medicine

## 2022-02-13 DIAGNOSIS — J029 Acute pharyngitis, unspecified: Secondary | ICD-10-CM | POA: Insufficient documentation

## 2022-02-13 LAB — POCT RAPID STREP A (OFFICE): Rapid Strep A Screen: NEGATIVE

## 2022-02-13 NOTE — ED Triage Notes (Signed)
Pt presents with sore throat with slight bilateral ear pain & chills since yesterday.

## 2022-02-13 NOTE — Discharge Instructions (Addendum)
Your strep test is negative.  Culture of the throat will be sent, and staff will notify you if that is in turn positive.  You can take ibuprofen over-the-counter 200 mg--2 every 6 hours as needed for pain or fever.

## 2022-02-13 NOTE — ED Provider Notes (Signed)
[facility]    CSN: [id number] Arrival date & time: 02/13/22  1031      History   Chief Complaint Chief Complaint  Patient presents with   Sore Throat    HPI Robin Hall is a 26 y.o. female.    Sore Throat   Here for sore throat that began yesterday.  She has had some sweats and felt chilled overnight.  Also both ears are hurting some.  No cough, and no vomiting or diarrhea.  Last menstrual cycle was May 31  Past Medical History:  Diagnosis Date   Herpes simplex     There are no problems to display for this patient.   Past Surgical History:  Procedure Laterality Date   NO PAST SURGERIES      OB History     Gravida  2   Para      Term      Preterm      AB      Living         SAB      IAB      Ectopic      Multiple      Live Births               Home Medications    Prior to Admission medications   Medication Sig Start Date End Date Taking? Authorizing Provider  valACYclovir (VALTREX) 1000 MG tablet Take 1,000 mg by mouth as needed.    [provider]    Family History Family History  Problem Relation Age of Onset   Diabetes Mother    Thyroid disease Father     Social History Social History   Tobacco Use   Smoking status: Never   Smokeless tobacco: Never  Vaping Use   Vaping Use: Never used  Substance Use Topics   Alcohol use: Not Currently   Drug use: Yes    Types: Marijuana    Comment: Last smoked 11/13/2020     Allergies   Patient has no known allergies.   Review of Systems Review of Systems   Physical Exam Triage Vital Signs ED Triage Vitals  Enc Vitals Group     BP 02/13/22 1049 105/74     Pulse Rate 02/13/22 1049 94     Resp 02/13/22 1049 18     Temp 02/13/22 1049 98.9 F (37.2 C)     Temp Source 02/13/22 1049 Oral     SpO2 02/13/22 1049 100 %     Weight --      Height --      Head Circumference --      Peak Flow --      Pain Score 02/13/22 1053 6     Pain Loc --       Pain Edu? --      Excl. in GC? --    No data found.  Updated Vital Signs BP 105/74 (BP Location: Left Arm)   Pulse 94   Temp 98.9 F (37.2 C) (Oral)   Resp 18   LMP 01/24/2022   SpO2 100%   Visual Acuity Right Eye Distance:   Left Eye Distance:   Bilateral Distance:    Right Eye Near:   Left Eye Near:    Bilateral Near:     Physical Exam Vitals reviewed.  Constitutional:      General: She is not in acute distress.    Appearance: She is not toxic-appearing.  HENT:     Right  Ear: Tympanic membrane and ear canal normal.     Left Ear: Tympanic membrane and ear canal normal.     Nose: Nose normal.     Mouth/Throat:     Mouth: Mucous membranes are moist.  Eyes:     Extraocular Movements: Extraocular movements intact.     Conjunctiva/sclera: Conjunctivae normal.     Pupils: Pupils are equal, round, and reactive to light.  Cardiovascular:     Rate and Rhythm: Normal rate and regular rhythm.     Heart sounds: No murmur heard. Pulmonary:     Effort: Pulmonary effort is normal. No respiratory distress.     Breath sounds: No wheezing, rhonchi or rales.  Chest:     Chest wall: No tenderness.  Musculoskeletal:     Cervical back: Neck supple.  Lymphadenopathy:     Cervical: No cervical adenopathy.  Skin:    Capillary Refill: Capillary refill takes less than 2 seconds.     Coloration: Skin is not jaundiced or pale.  Neurological:     General: No focal deficit present.     Mental Status: She is alert and oriented to person, place, and time.  Psychiatric:        Behavior: Behavior normal.      UC Treatments / Results  Labs (all labs ordered are listed, but only abnormal results are displayed) Labs Reviewed  CULTURE, GROUP A STREP [facility])  POCT RAPID STREP A (OFFICE)    EKG   Radiology No results found.  Procedures Procedures (including critical care time)  Medications Ordered in UC Medications - No data to display  Initial Impression / Assessment and  Plan / UC Course  I have reviewed the triage vital signs and the nursing notes.  Pertinent labs & imaging results that were available during my care of the patient were reviewed by me and considered in my medical decision making (see chart for details).     Rapid strep is negative, so we will send the swab for culture, and treat per protocol if positive Final Clinical Impressions(s) / UC Diagnoses   Final diagnoses:  Acute pharyngitis, unspecified etiology     Discharge Instructions      Your strep test is negative.  Culture of the throat will be sent, and staff will notify you if that is in turn positive.  You can take ibuprofen over-the-counter 200 mg--2 every 6 hours as needed for pain or fever.     ED Prescriptions   None    PDMP not reviewed this encounter.   Zenia Resides, MD 02/13/22 1131

## 2022-02-15 LAB — CULTURE, GROUP A STREP (THRC)

## 2022-02-16 ENCOUNTER — Telehealth (HOSPITAL_COMMUNITY): Payer: Self-pay | Admitting: Emergency Medicine

## 2022-02-16 MED ORDER — AMOXICILLIN 500 MG PO CAPS: 500.0000 mg | ORAL_CAPSULE | Freq: Two times a day (BID) | ORAL | 0 refills | Status: AC

## 2022-05-16 IMAGING — US US OB < 14 WEEKS - US OB TV
1 series · 15 of 28 positions shown · non-contrast
Comparison: None

CLINICAL DATA: Vaginal bleeding affecting first trimester
pregnancy; LMP 09/27/2020; no quantitative beta HCG available for
correlation

EXAM:
OBSTETRIC <14 WK US AND TRANSVAGINAL OB US
TECHNIQUE: Both transabdominal and transvaginal ultrasound examinations were
performed for complete evaluation of the gestation as well as the
maternal uterus, adnexal regions, and pelvic cul-de-sac.
Transvaginal technique was performed to assess early pregnancy.

[Series 1: us ob < 14 weeks - us ob tv · 15 of 57 slices shown]
[im 1/57]
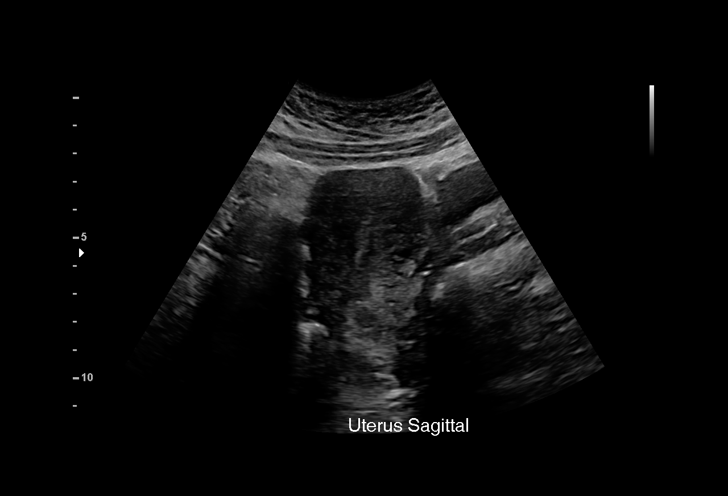
[im 5/57]
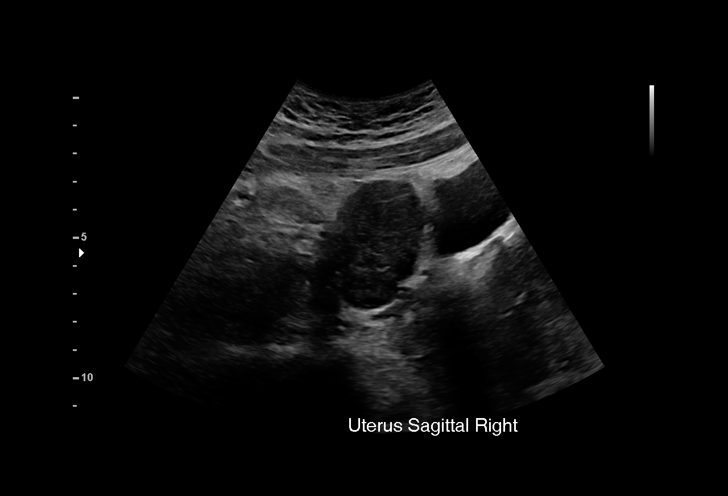
[im 9/57]
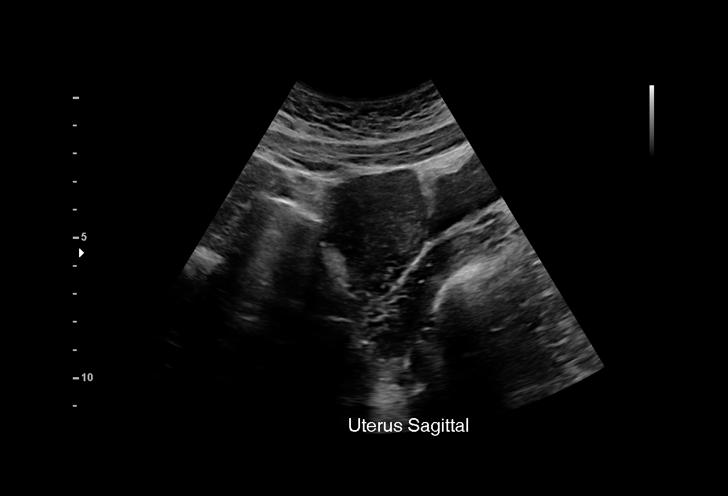
[im 13/57]
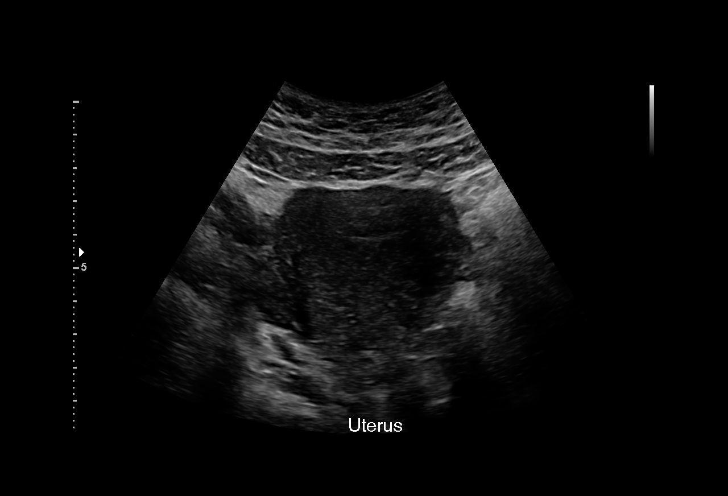
[im 17/57]
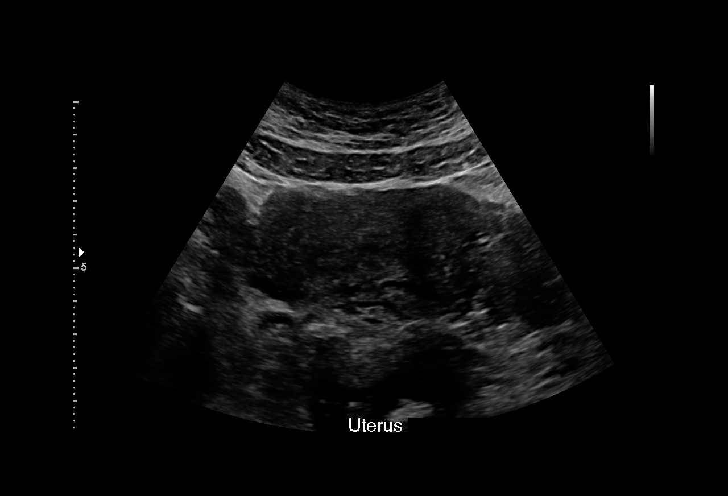
[im 21/57]
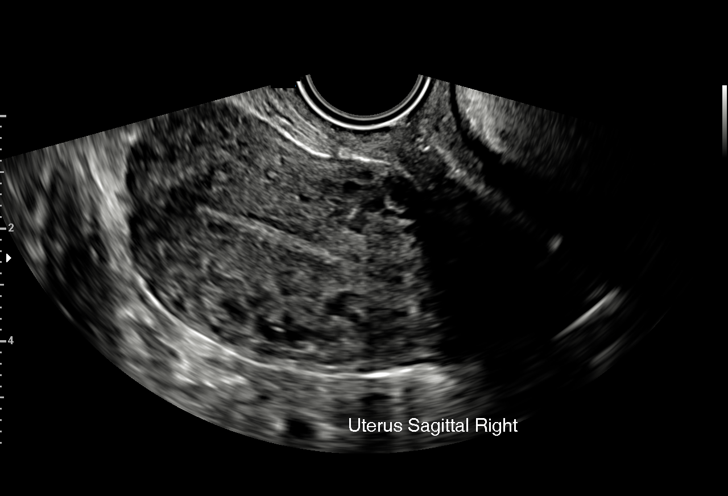
[im 25/57]
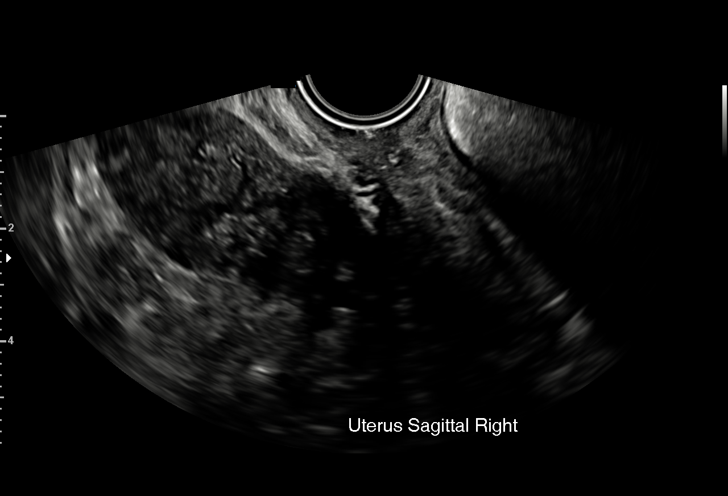
[im 30/57]
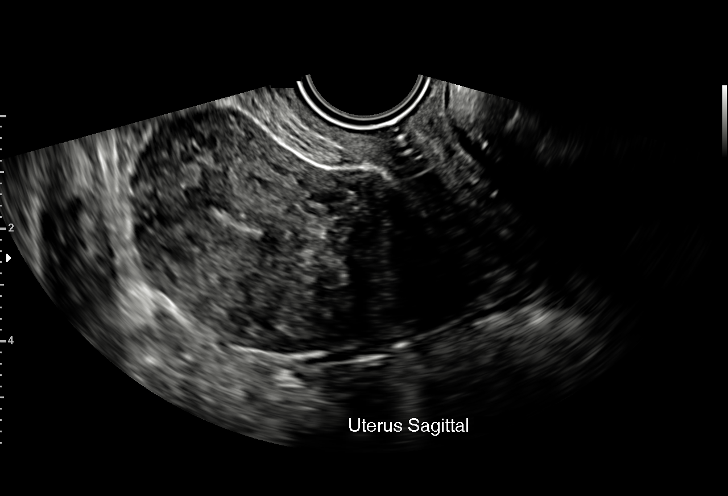
[im 32/57]
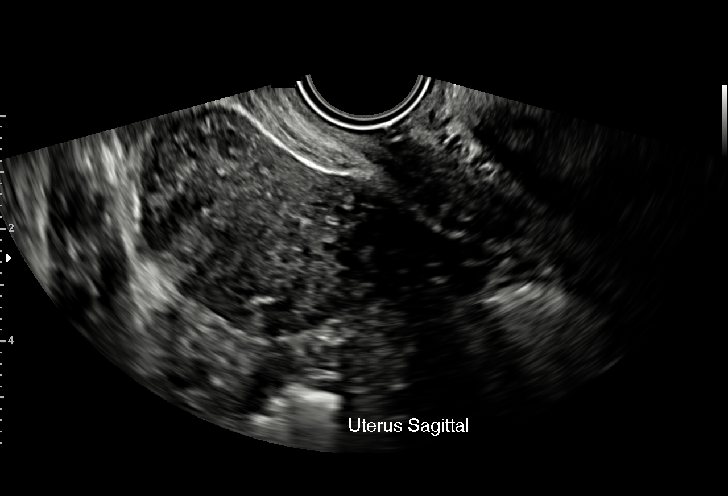
[im 36/57]
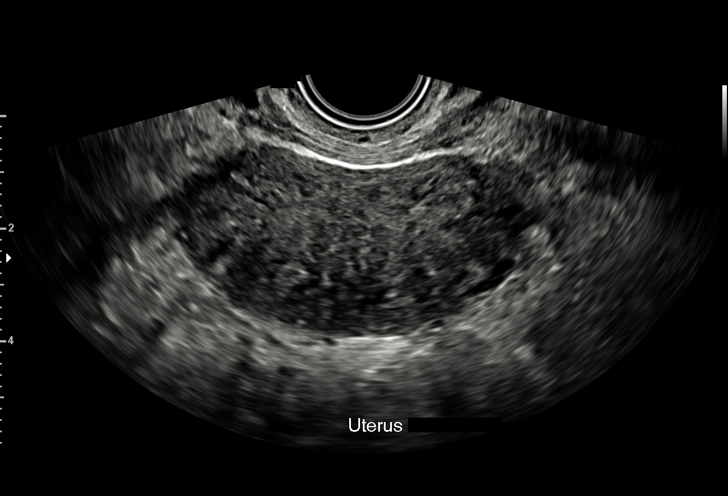
[im 40/57]
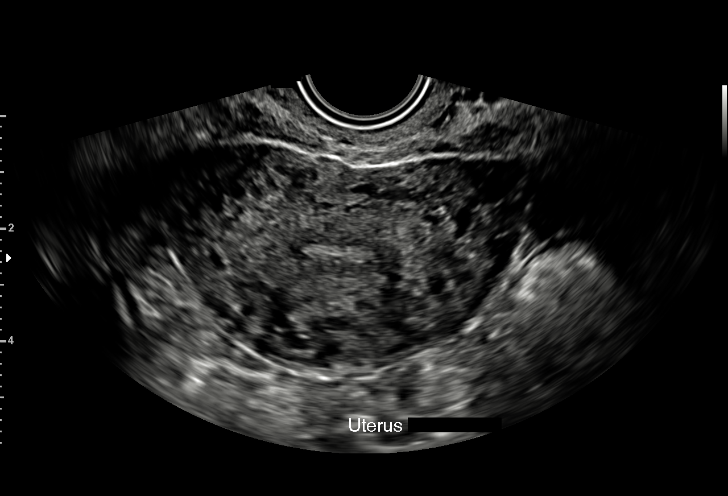
[im 44/57]
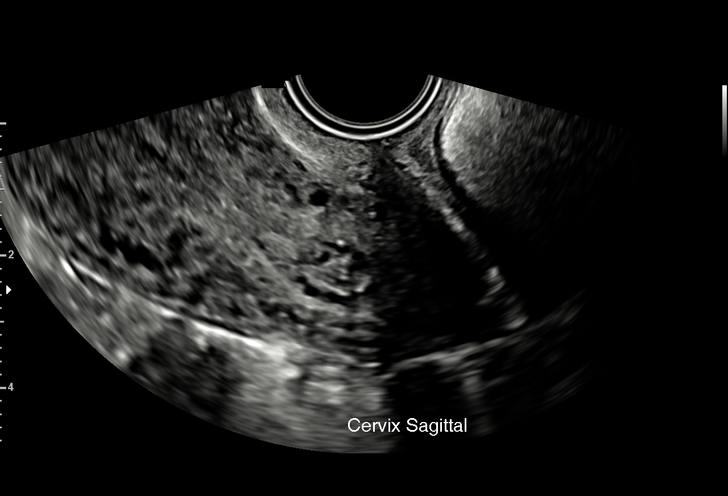
[im 48/57]
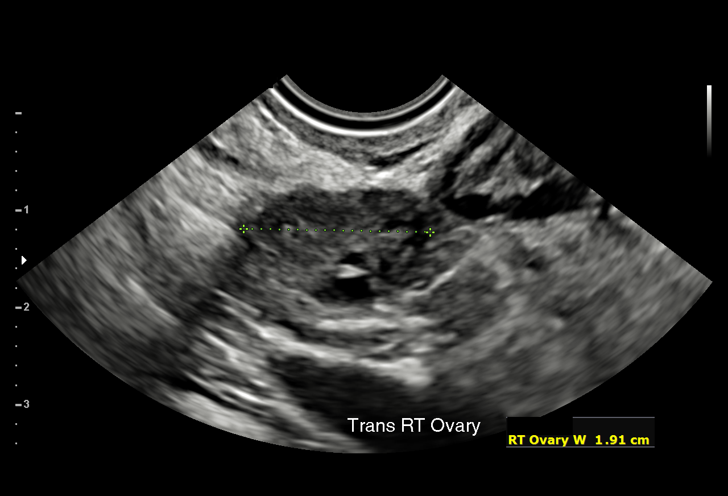
[im 52/57]
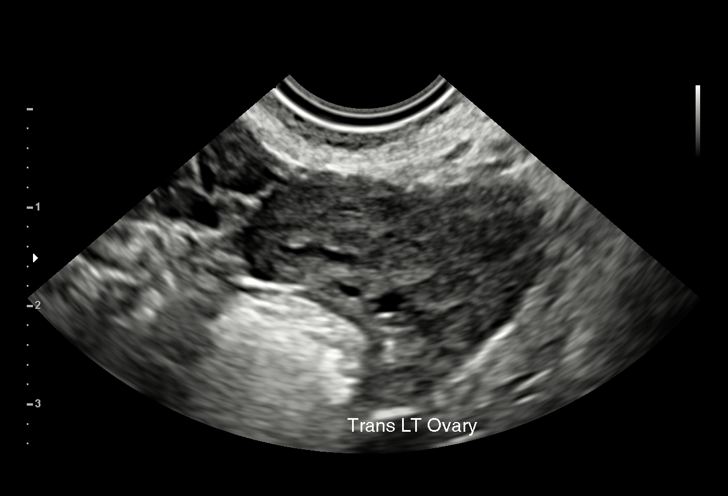
[im 57/57]
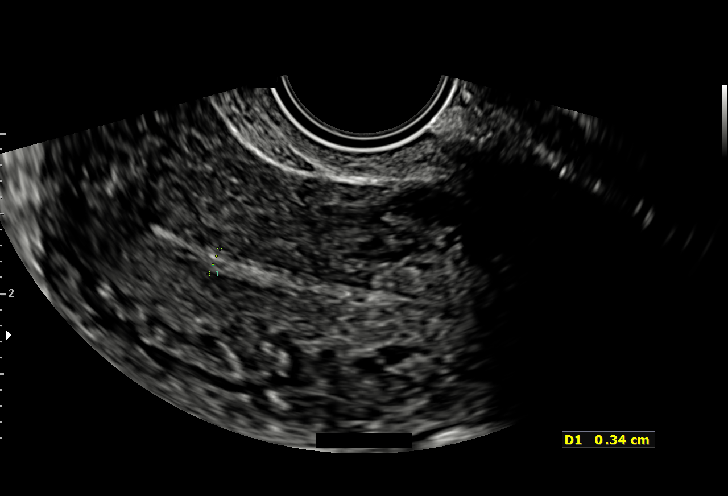

[15 of 28 positions shown; findings below may reference images not displayed]

FINDINGS: Intrauterine gestational sac: Not identified

Yolk sac:  N/A

Embryo:  N/A

Cardiac Activity: N/A

Heart Rate: N/A  bpm

MSD:   mm    w     d

CRL:    mm    w    d                  US EDC:

Subchorionic hemorrhage:  N/A

Maternal uterus/adnexae:

Uterus anteverted, normal appearance.

No evidence of uterine mass or gestational sac.

Heterogeneous appearance of tissue along the endocervical canal
likely prominent endocervical glandular tissue.

Endometrial complex normal appearance 3 mm thick.

RIGHT ovary normal size and morphology 2.7 x 1.9 x 1.4 cm.

LEFT ovary normal size and morphology 1.7 x 1.3 x 3.0 cm.

No free pelvic fluid or adnexal masses.
IMPRESSION: No intrauterine gestation identified.

Findings are consistent with pregnancy of unknown location.

Differential diagnosis includes early intrauterine pregnancy too
early to visualize, spontaneous abortion, and ectopic pregnancy.

Serial quantitative beta HCG and or follow-up ultrasound recommended
to definitively exclude ectopic pregnancy.

## 2022-11-20 ENCOUNTER — Ambulatory Visit (INDEPENDENT_AMBULATORY_CARE_PROVIDER_SITE_OTHER): Payer: Medicaid Other

## 2022-11-20 VITALS — BP 105/74 | HR 80 | Wt 152.0 lb

## 2022-11-20 DIAGNOSIS — N926 Irregular menstruation, unspecified: Secondary | ICD-10-CM

## 2022-11-20 LAB — POCT URINE PREGNANCY: Preg Test, Ur: POSITIVE — AB

## 2022-11-20 NOTE — Progress Notes (Signed)
Robin Hall presents today for UPT. She has no unusual complaints. LMP: 10/04/22    OBJECTIVE: Appears well, in no apparent distress.   Home UPT Result: positive  In-Office UPT result: positive   ASSESSMENT: Positive pregnancy test  PLAN Prenatal care to be completed at:  [facility]

## 2022-12-10 ENCOUNTER — Inpatient Hospital Stay (HOSPITAL_COMMUNITY): Payer: Medicaid Other

## 2022-12-10 ENCOUNTER — Inpatient Hospital Stay (HOSPITAL_COMMUNITY)
Admission: AD | Admit: 2022-12-10 | Discharge: 2022-12-11 | Disposition: A | Discharge status: Home / Self Care | Payer: Medicaid Other | Attending: Obstetrics & Gynecology | Admitting: Obstetrics & Gynecology

## 2022-12-10 ENCOUNTER — Encounter (HOSPITAL_COMMUNITY): Payer: Self-pay | Admitting: *Deleted

## 2022-12-10 DIAGNOSIS — F1290 Cannabis use, unspecified, uncomplicated: Secondary | ICD-10-CM | POA: Insufficient documentation

## 2022-12-10 DIAGNOSIS — R1084 Generalized abdominal pain: Secondary | ICD-10-CM | POA: Insufficient documentation

## 2022-12-10 DIAGNOSIS — O26899 Other specified pregnancy related conditions, unspecified trimester: Secondary | ICD-10-CM

## 2022-12-10 DIAGNOSIS — Z3A09 9 weeks gestation of pregnancy: Secondary | ICD-10-CM | POA: Diagnosis not present

## 2022-12-10 DIAGNOSIS — O3680X0 Pregnancy with inconclusive fetal viability, not applicable or unspecified: Secondary | ICD-10-CM | POA: Diagnosis not present

## 2022-12-10 DIAGNOSIS — O99321 Drug use complicating pregnancy, first trimester: Secondary | ICD-10-CM | POA: Insufficient documentation

## 2022-12-10 DIAGNOSIS — O209 Hemorrhage in early pregnancy, unspecified: Secondary | ICD-10-CM | POA: Insufficient documentation

## 2022-12-10 DIAGNOSIS — O26891 Other specified pregnancy related conditions, first trimester: Secondary | ICD-10-CM | POA: Diagnosis not present

## 2022-12-10 MED ORDER — RHO D IMMUNE GLOBULIN 1500 UNIT/2ML IJ SOSY
300.0000 ug | PREFILLED_SYRINGE | Freq: Once | INTRAMUSCULAR | Status: AC
  Administered 2022-12-11: 300 ug via INTRAMUSCULAR
  Filled 2022-12-10: qty 2

## 2022-12-10 MED ORDER — ACETAMINOPHEN 500 MG PO TABS
1000.0000 mg | ORAL_TABLET | Freq: Once | ORAL | Status: AC
  Administered 2022-12-10: 1000 mg via ORAL
  Filled 2022-12-10: qty 2

## 2022-12-10 NOTE — MAU Note (Signed)
Pt says she started VB last Tuesday-  But started cramping , shoulder, and back pain  today - worse now Went to an office on [address]- positive UPT-  Has an appointment tomorrow  Pad in Triage - mod amt red blood on pad

## 2022-12-11 DIAGNOSIS — Z3A09 9 weeks gestation of pregnancy: Secondary | ICD-10-CM

## 2022-12-11 DIAGNOSIS — O3680X0 Pregnancy with inconclusive fetal viability, not applicable or unspecified: Secondary | ICD-10-CM

## 2022-12-11 LAB — CBC
HCT: 40.9 % (ref 36.0–46.0)
Hemoglobin: 13.6 g/dL (ref 12.0–15.0)
MCH: 30.6 pg (ref 26.0–34.0)
MCHC: 33.3 g/dL (ref 30.0–36.0)
MCV: 91.9 fL (ref 80.0–100.0)
Platelets: 365 10*3/uL (ref 150–400)
RBC: 4.45 MIL/uL (ref 3.87–5.11)
RDW: 12.6 % (ref 11.5–15.5)
WBC: 10.5 10*3/uL (ref 4.0–10.5)
nRBC: 0 % (ref 0.0–0.2)

## 2022-12-11 LAB — HCG, QUANTITATIVE, PREGNANCY: hCG, Beta Chain, Quant, S: 1589 m[IU]/mL — ABNORMAL HIGH (ref <5)

## 2022-12-11 NOTE — MAU Provider Note (Signed)
History     CSN: [id number]  Arrival date and time: 12/10/22 2232   Event Date/Time   First Provider Initiated Contact with Patient 12/11/22 0032      Chief Complaint  Patient presents with   Vaginal Bleeding   Abdominal Pain   HPI Robin Hall is a 27 y.o. G3P0020 at [redacted]w[redacted]d by LMP. She presents to MAU with chief complaints of abdominal cramping and vaginal bleeding 2/2 positive UPT at [facility]. These are new complaints, onset Tuesday 12/04/2022. Bleeding is small to moderate and consistent but she is not currently saturating pads. Her abdominal pain is generalized to her lower abdomen. She has not taken medication for this complaint. She drove herself to the hospital and must drive herself home.   OB History     Gravida  3   Para      Term      Preterm      AB  2   Living         SAB  1   IAB      Ectopic  1   Multiple      Live Births              Past Medical History:  Diagnosis Date   Herpes simplex     Past Surgical History:  Procedure Laterality Date   NO PAST SURGERIES      Family History  Problem Relation Age of Onset   Diabetes Mother    Thyroid disease Father     Social History   Tobacco Use   Smoking status: Never   Smokeless tobacco: Never  Vaping Use   Vaping Use: Never used  Substance Use Topics   Alcohol use: Not Currently   Drug use: Yes    Types: Marijuana    Comment: Last smoked today-12-10-2022    Allergies: No Known Allergies  Medications Prior to Admission  Medication Sig Dispense Refill Last Dose   valACYclovir (VALTREX) 1000 MG tablet Take 1,000 mg by mouth as needed.   12/10/2022    Review of Systems  Gastrointestinal:  Positive for abdominal pain.  Genitourinary:  Positive for vaginal bleeding.  All other systems reviewed and are negative.  Physical Exam   Blood pressure 118/75, pulse 70, temperature 98.5 F (36.9 C), temperature source Oral, resp. rate 16, height  (1.549 m), weight 70.9 kg,  last menstrual period 10/04/2022, unknown if currently breastfeeding.  Physical Exam Vitals and nursing note reviewed. Exam conducted with a chaperone present.  Constitutional:      Appearance: She is well-developed.  Cardiovascular:     Rate and Rhythm: Normal rate.     Heart sounds: Normal heart sounds.  Pulmonary:     Effort: Pulmonary effort is normal.  Abdominal:     Palpations: Abdomen is soft.     Tenderness: There is no abdominal tenderness.  Genitourinary:    Comments: Pelvic exam: External genitalia normal, vaginal walls pink and well rugated, cervix visually closed, no lesions noted. Small amount of dark red blood disseminated throughout vaginal vault   Neurological:     Mental Status: She is alert.     MAU Course  Procedures  MDM Orders Placed This Encounter  Procedures   US OB LESS THAN 14 WEEKS WITH OB TRANSVAGINAL   CBC   hCG, quantitative, pregnancy   Rh IG workup (includes ABO/Rh)   Discharge patient   Patient Vitals for the past 24 hrs:  BP Temp Temp src Pulse  Resp Height Weight  12/11/22 0113 120/81 -- -- (!) 52 -- -- --  12/10/22 2257 118/75 98.5 F (36.9 C) Oral 70 16  (1.549 m) 70.9 kg   Results for orders placed or performed during the hospital encounter of 12/10/22 (from the past 24 hour(s))  Rh IG workup (includes ABO/Rh)     Status: None (Preliminary result)   Collection Time: 12/10/22 11:14 PM  Result Value Ref Range   Gestational Age(Wks) 9    ABO/RH(D) A NEG    Antibody Screen NEG    Unit Number [id number]    Blood Component Type RHIG    Unit division 00    Status of Unit ISSUED    Transfusion Status      OK TO TRANSFUSE Performed at [facility], [address], [place], [place] [address]   CBC     Status: None   Collection Time: 12/10/22 11:16 PM  Result Value Ref Range   WBC 10.5 4.0 - 10.5 K/uL   RBC 4.45 3.87 - 5.11 MIL/uL   Hemoglobin 13.6 12.0 - 15.0 g/dL   HCT 78.2 95.6 - 21.3 %   MCV 91.9 80.0 - 100.0  fL   MCH 30.6 26.0 - 34.0 pg   MCHC 33.3 30.0 - 36.0 g/dL   RDW 08.6 57.8 - 46.9 %   Platelets 365 150 - 400 K/uL   nRBC 0.0 0.0 - 0.2 %  hCG, quantitative, pregnancy     Status: Abnormal   Collection Time: 12/10/22 11:16 PM  Result Value Ref Range   hCG, Beta Chain, Quant, S 1,589 (H) <5 mIU/mL   US OB LESS THAN 14 WEEKS WITH OB TRANSVAGINAL  Result Date: 12/11/2022 CLINICAL DATA:  Cramping and vaginal bleeding since Tuesday. Beta HCG unavailable. LMP 10/04/2022. GA by LMP 9 weeks 4 days EXAM: OBSTETRIC <14 WK [place] AND TRANSVAGINAL OB US TECHNIQUE: Both transabdominal and transvaginal ultrasound examinations were performed for complete evaluation of the gestation as well as the maternal uterus, adnexal regions, and pelvic cul-de-sac. Transvaginal technique was performed to assess early pregnancy. COMPARISON:  None Available. FINDINGS: Intrauterine gestational sac: None Yolk sac:  Not Visualized. Embryo:  Not Visualized. Cardiac Activity: Not Visualized. Heart Rate: Not applicable bpm MSD:   mm    w     d CRL:    mm    w    d                  [place] EDC: Subchorionic hemorrhage:  None visualized. Maternal uterus/adnexae: Heterogenous endometrium. The endometrium measures 18 mm. Color Doppler evaluation was not performed due to new pregnancy. Normal ovaries and adnexa. No free fluid. IMPRESSION: Thickened heterogenous endometrium measuring 18 mm. No intrauterine pregnancy. In the setting of positive pregnancy test, this reflects a pregnancy of unknown location. Differential considerations include miscarriage, early normal IUP, or nonvisualized ectopic pregnancy. Differentiation is achieved with serial beta HCG supplemented by repeat sonography as clinically warranted. Electronically Signed   By: Minerva Fester M.D.   On: 12/11/2022 00:24    Meds ordered this encounter  Medications   acetaminophen (TYLENOL) tablet 1,000 mg   rho (d) immune globulin (RHIG/RHOPHYLAC) injection 300 mcg   Assessment and  Plan  --27 y.o. G3P0020 at [redacted]w[redacted]d by LMP --Bleeding x 1 week, no IUP on formal imaging --Concern for threatened miscarriage --Hgb 13.6 -- Blood type A NEG, S/p Rhogam in MAU --Discussed expectations for change in quant hCG for viable pregnancy vs  miscarriage --Discharge home in stable condition with bleeding precautions  F/U: --Appt made for repeat stat Quant hCG at [facility] on Thursday 12/13/2022  Calvert Cantor, MSA, MSN, CNM 12/11/2022, 2:20 AM

## 2022-12-12 LAB — RH IG WORKUP (INCLUDES ABO/RH)
ABO/RH(D): A NEG
Antibody Screen: NEGATIVE
Gestational Age(Wks): 9
Unit division: 0

## 2022-12-13 ENCOUNTER — Other Ambulatory Visit: Payer: Medicaid Other

## 2022-12-13 VITALS — BP 95/62 | HR 60

## 2022-12-13 DIAGNOSIS — O3680X0 Pregnancy with inconclusive fetal viability, not applicable or unspecified: Secondary | ICD-10-CM

## 2022-12-13 LAB — BETA HCG QUANT (REF LAB): hCG Quant: 94 m[IU]/mL

## 2022-12-13 NOTE — Progress Notes (Signed)
Pt c/o vaginal bleeding and here for STAT Hcg.

## 2022-12-20 ENCOUNTER — Other Ambulatory Visit: Payer: Medicaid Other

## 2022-12-26 DIAGNOSIS — O039 Complete or unspecified spontaneous abortion without complication: Secondary | ICD-10-CM

## 2022-12-26 HISTORY — DX: Complete or unspecified spontaneous abortion without complication: O03.9

## 2023-01-09 ENCOUNTER — Encounter: Payer: Self-pay | Admitting: Obstetrics and Gynecology

## 2023-09-27 ENCOUNTER — Ambulatory Visit (HOSPITAL_BASED_OUTPATIENT_CLINIC_OR_DEPARTMENT_OTHER)
Admission: RE | Admit: 2023-09-27 | Discharge: 2023-09-27 | Disposition: A | Discharge status: Home / Self Care | Payer: Self-pay | Source: Ambulatory Visit | Attending: Physician Assistant | Admitting: Physician Assistant

## 2023-09-27 ENCOUNTER — Telehealth: Payer: Self-pay

## 2023-09-27 ENCOUNTER — Ambulatory Visit
Admission: EM | Admit: 2023-09-27 | Discharge: 2023-09-27 | Disposition: A | Discharge status: Home / Self Care | Payer: Self-pay | Attending: Physician Assistant | Admitting: Physician Assistant

## 2023-09-27 DIAGNOSIS — S99912A Unspecified injury of left ankle, initial encounter: Secondary | ICD-10-CM

## 2023-09-27 NOTE — Telephone Encounter (Signed)
Robin Hall had to leave early today due to illness can you review the patients results and advise please (see below):  CLINICAL DATA:  Left ankle pain and swelling after fall 1 day prior   EXAM: LEFT ANKLE COMPLETE - 3+ VIEW   COMPARISON:  None Available.   FINDINGS: Moderate lateral left ankle soft tissue swelling. No fracture or subluxation. No focal osseous lesions. No significant arthropathy. No radiopaque foreign bodies.   IMPRESSION: Moderate lateral left ankle soft tissue swelling. No fracture or subluxation.     Electronically Signed   By: Delbert Phenix M.D.   On: 09/27/2023 09:27

## 2023-09-27 NOTE — ED Triage Notes (Signed)
"  Last night I was trying to get my dog untangled and instead of falling landing my weight on my left ankle twisting it". Still good ROM, No swelling "just hurts". DOI: 01-30 HS.

## 2023-09-27 NOTE — Discharge Instructions (Signed)
  Please report to [facility] for Xray  [address] [place], [place]  We will call with results once available.

## 2024-04-14 ENCOUNTER — Inpatient Hospital Stay (HOSPITAL_COMMUNITY)
Admission: AD | Admit: 2024-04-14 | Discharge: 2024-04-14 | Discharge status: Against Medical Advice | Payer: Self-pay | Attending: Obstetrics and Gynecology | Admitting: Obstetrics and Gynecology

## 2024-04-14 NOTE — Progress Notes (Signed)
Not in lobby

## 2024-04-14 NOTE — Progress Notes (Signed)
Pt called for Triage and not in lobby 

## 2024-05-18 ENCOUNTER — Inpatient Hospital Stay (HOSPITAL_COMMUNITY)
Admission: AD | Admit: 2024-05-18 | Discharge: 2024-05-19 | Disposition: A | Discharge status: Home / Self Care | Payer: MEDICAID | Attending: Obstetrics and Gynecology | Admitting: Obstetrics and Gynecology

## 2024-05-18 ENCOUNTER — Other Ambulatory Visit: Payer: Self-pay

## 2024-05-18 ENCOUNTER — Inpatient Hospital Stay (HOSPITAL_COMMUNITY): Payer: MEDICAID

## 2024-05-18 ENCOUNTER — Encounter (HOSPITAL_COMMUNITY): Payer: Self-pay

## 2024-05-18 DIAGNOSIS — Z3A01 Less than 8 weeks gestation of pregnancy: Secondary | ICD-10-CM | POA: Diagnosis not present

## 2024-05-18 DIAGNOSIS — O26899 Other specified pregnancy related conditions, unspecified trimester: Secondary | ICD-10-CM

## 2024-05-18 DIAGNOSIS — Z6791 Unspecified blood type, Rh negative: Secondary | ICD-10-CM

## 2024-05-18 DIAGNOSIS — F1290 Cannabis use, unspecified, uncomplicated: Secondary | ICD-10-CM | POA: Diagnosis not present

## 2024-05-18 DIAGNOSIS — O008 Other ectopic pregnancy without intrauterine pregnancy: Secondary | ICD-10-CM | POA: Diagnosis present

## 2024-05-18 DIAGNOSIS — O99321 Drug use complicating pregnancy, first trimester: Secondary | ICD-10-CM | POA: Diagnosis not present

## 2024-05-18 LAB — COMPREHENSIVE METABOLIC PANEL WITH GFR
ALT: 29 U/L (ref 0–44)
AST: 20 U/L (ref 15–41)
Albumin: 3.5 g/dL (ref 3.5–5.0)
Alkaline Phosphatase: 41 U/L (ref 38–126)
Anion gap: 10 (ref 5–15)
BUN: 8 mg/dL (ref 6–20)
CO2: 23 mmol/L (ref 22–32)
Calcium: 9.1 mg/dL (ref 8.9–10.3)
Chloride: 104 mmol/L (ref 98–111)
Creatinine, Ser: 0.62 mg/dL (ref 0.44–1.00)
GFR, Estimated: >60 mL/min (ref >60)
Glucose, Bld: 101 mg/dL — ABNORMAL HIGH (ref 70–99)
Potassium: 3.6 mmol/L (ref 3.5–5.1)
Sodium: 137 mmol/L (ref 135–145)
Total Bilirubin: 0.5 mg/dL (ref 0.0–1.2)
Total Protein: 6.6 g/dL (ref 6.5–8.1)

## 2024-05-18 LAB — URINALYSIS, ROUTINE W REFLEX MICROSCOPIC
Bilirubin Urine: NEGATIVE
Glucose, UA: NEGATIVE mg/dL
Hgb urine dipstick: NEGATIVE
Ketones, ur: NEGATIVE mg/dL
Leukocytes,Ua: NEGATIVE
Nitrite: NEGATIVE
Protein, ur: NEGATIVE mg/dL
Specific Gravity, Urine: 1.018 (ref 1.005–1.030)
pH: 5 (ref 5.0–8.0)

## 2024-05-18 LAB — TYPE AND SCREEN
ABO/RH(D): A NEG
Antibody Screen: NEGATIVE

## 2024-05-18 LAB — CBC
HCT: 40.2 % (ref 36.0–46.0)
Hemoglobin: 13.7 g/dL (ref 12.0–15.0)
MCH: 30.8 pg (ref 26.0–34.0)
MCHC: 34.1 g/dL (ref 30.0–36.0)
MCV: 90.3 fL (ref 80.0–100.0)
Platelets: 360 K/uL (ref 150–400)
RBC: 4.45 MIL/uL (ref 3.87–5.11)
RDW: 12.7 % (ref 11.5–15.5)
WBC: 12.1 K/uL — ABNORMAL HIGH (ref 4.0–10.5)
nRBC: 0 % (ref 0.0–0.2)

## 2024-05-18 LAB — ABO/RH
ABO/RH(D): A NEG
Antibody Screen: NEGATIVE

## 2024-05-18 LAB — POCT PREGNANCY, URINE: Preg Test, Ur: POSITIVE — AB

## 2024-05-18 LAB — HCG, QUANTITATIVE, PREGNANCY: hCG, Beta Chain, Quant, S: 21389 m[IU]/mL — ABNORMAL HIGH (ref <5)

## 2024-05-18 MED ORDER — LACTATED RINGERS IV SOLN: INTRAVENOUS | Status: DC

## 2024-05-18 NOTE — MAU Provider Note (Incomplete)
 Chief Complaint:  Threatened Miscarriage   HPI   None     Robin Hall is a 28 y.o. G4P0020 at [redacted]w[redacted]d who presents to maternity admissions reporting concern for a miscarriage. She was seen at [facility] today for an ultrasound - she was told that they were able to see the fetus but there was no heartbeat, and recommended that she come to the hospital for further evaluation.   Pregnancy Course: She has been seen at the [facility] but has not established prenatal care elsewhere.  Past Medical History:  Diagnosis Date   Herpes simplex    Miscarriage 12/2022   OB History  Gravida Para Term Preterm AB Living  4    2   SAB IAB Ectopic Multiple Live Births  1  1      # Outcome Date GA Lbr Len/2nd Weight Sex Type Anes PTL Lv  4 Current           3 Ectopic           2 SAB           1 Gravida            Past Surgical History:  Procedure Laterality Date   NO PAST SURGERIES     Family History  Problem Relation Age of Onset   Diabetes Mother    Thyroid disease Father    Social History   Tobacco Use   Smoking status: Never    Passive exposure: Never   Smokeless tobacco: Never  Vaping Use   Vaping status: Never Used  Substance Use Topics   Alcohol use: Not Currently   Drug use: Yes    Types: Marijuana    Comment: Last smoked today-12-10-2022   No Known Allergies Medications Prior to Admission  Medication Sig Dispense Refill Last Dose/Taking   valACYclovir (VALTREX) 1000 MG tablet Take 1,000 mg by mouth as needed.       I have reviewed patient's Past Medical Hx, Surgical Hx, Family Hx, Social Hx, medications and allergies.   ROS  Pertinent items noted in HPI and remainder of comprehensive ROS otherwise negative.   PHYSICAL EXAM  Patient Vitals for the past 24 hrs:  BP Temp Temp src Pulse Resp SpO2 Height Weight  05/18/24 1924 112/74 98.2 F (36.8 C) Oral (!) 110 18 97 % 5' 1 (1.549 m) 72.4 kg    Constitutional:  Well-developed, well-nourished female in no acute distress.  Cardiovascular: Warm and well-perfused Respiratory: normal effort, no problems with respiration noted GI: Abd soft, non-tender, non-distended MS: Extremities nontender, no edema, normal ROM Neurologic: Alert and oriented x 4.  Pelvic: deferred    Labs: Results for orders placed or performed during the hospital encounter of 05/18/24 (from the past 24 hours)  Pregnancy, urine POC     Status: Abnormal   Collection Time: 05/18/24  7:19 PM  Result Value Ref Range   Preg Test, Ur POSITIVE (A) NEGATIVE  Urinalysis, Routine w reflex microscopic -Urine, Clean Catch     Status: Abnormal   Collection Time: 05/18/24  8:13 PM  Result Value Ref Range   Color, Urine YELLOW YELLOW   APPearance HAZY (A) CLEAR   Specific Gravity, Urine 1.018 1.005 - 1.030   pH 5.0 5.0 - 8.0   Glucose, UA NEGATIVE NEGATIVE mg/dL   Hgb urine dipstick NEGATIVE NEGATIVE   Bilirubin Urine NEGATIVE NEGATIVE   Ketones, ur NEGATIVE NEGATIVE mg/dL   Protein, ur NEGATIVE NEGATIVE mg/dL  Nitrite NEGATIVE NEGATIVE   Leukocytes,Ua NEGATIVE NEGATIVE  Comprehensive metabolic panel with GFR     Status: Abnormal   Collection Time: 05/18/24  8:20 PM  Result Value Ref Range   Sodium 137 135 - 145 mmol/L   Potassium 3.6 3.5 - 5.1 mmol/L   Chloride 104 98 - 111 mmol/L   CO2 23 22 - 32 mmol/L   Glucose, Bld 101 (H) 70 - 99 mg/dL   BUN 8 6 - 20 mg/dL   Creatinine, Ser 9.37 0.44 - 1.00 mg/dL   Calcium 9.1 8.9 - 89.6 mg/dL   Total Protein 6.6 6.5 - 8.1 g/dL   Albumin 3.5 3.5 - 5.0 g/dL   AST 20 15 - 41 U/L   ALT 29 0 - 44 U/L   Alkaline Phosphatase 41 38 - 126 U/L   Total Bilirubin 0.5 0.0 - 1.2 mg/dL   GFR, Estimated >39 >39 mL/min   Anion gap 10 5 - 15  CBC     Status: Abnormal   Collection Time: 05/18/24  8:20 PM  Result Value Ref Range   WBC 12.1 (H) 4.0 - 10.5 K/uL   RBC 4.45 3.87 - 5.11 MIL/uL   Hemoglobin 13.7 12.0 - 15.0 g/dL   HCT 59.7 63.9 - 53.9 %    MCV 90.3 80.0 - 100.0 fL   MCH 30.8 26.0 - 34.0 pg   MCHC 34.1 30.0 - 36.0 g/dL   RDW 87.2 88.4 - 84.4 %   Platelets 360 150 - 400 K/uL   nRBC 0.0 0.0 - 0.2 %  ABO/Rh     Status: None   Collection Time: 05/18/24  8:23 PM  Result Value Ref Range   ABO/RH(D) A NEG    Antibody Screen      NEG Performed at [facility], [address] [place], [place] [address]     Imaging:  US  OB LESS THAN 14 WEEKS WITH OB TRANSVAGINAL Result Date: 05/18/2024 EXAM: OBSTETRIC ULTRASOUND FIRST TRIMESTER TECHNIQUE: Transvaginal first trimester obstetric pelvic duplex ultrasound was performed with real-time imaging, color flow Doppler imaging, and spectral analysis. COMPARISON: None provided. CLINICAL HISTORY: First trimester pregnancy. FINDINGS: UTERUS: Right corneal ectopic with thinning of the overlying myometrium. No intrauterine gestation. GESTATIONAL SAC(S): No intrauterine gestational sac. YOLK SAC: Present EMBRYO(<11WK) /FETUS(>=11WK): Single CROWN RUMP LENGTH: 13.4 mm (7 weeks 4 days) RATE OF CARDIAC ACTIVITY: No cardiac activity. RIGHT OVARY: Unremarkable. Normal arterial and venous flow. LEFT OVARY: Unremarkable. Normal arterial and venous flow. FREE FLUID: No free fluid. MEASUREMENTS ESTIMATED GESTATIONAL AGE BY CURRENT ULTRASOUND: 7 weeks 4 days IMPRESSION: 1. Right corneal ectopic pregnancy without cardiac activity, as above. 2. Critical Value/emergent results were called by telephone at the time of interpretation on 05/18/2024 at 2115 hrs to provider Dr Abigail. Electronically signed by: Pinkie Pebbles MD 05/18/2024 09:16 PM EDT RP Workstation: [id number]    MDM & MAU COURSE  MDM: {[id number]}  MAU Course: Orders Placed This Encounter  Procedures   US  OB LESS THAN 14 WEEKS WITH OB TRANSVAGINAL   Urinalysis, Routine w reflex microscopic -Urine, Clean Catch   Comprehensive metabolic panel with GFR   CBC   hCG, quantitative, pregnancy   Pregnancy, urine POC   ABO/Rh   No orders of the  defined types were placed in this encounter.  *** ASSESSMENT  No diagnosis found.  PLAN   ***    Allergies as of 05/18/2024   No Known Allergies   Med Rec must be completed prior to  using this SMARTLINK***       Charlie Courts, MD  Family Medicine - Obstetrics Fellow

## 2024-05-18 NOTE — MAU Note (Signed)
 RH neg.    DATE & TIME NOTIFIED: 05/18/24  MESSENGER (representative from lab): Emerald   TIME OF NOTIFICATION: 0910pm.

## 2024-05-18 NOTE — H&P (Addendum)
 [facility] Attending History and Physical  Robin Hall is a 28 y.o. G4P0030 who presented to MAU today for evaluation of possible miscarriage.   She was seen at [facility] and was told that the fetus had no heartbeat. She came here for confirmation. She denies vaginal bleeding or abdominal pain.  She has a history of ectopic in 2022, managed with methotrexate .  Denies any abnormal vaginal discharge, fevers, chills, sweats, dysuria, nausea, vomiting, other GI or GU symptoms or other general symptoms.  Past Medical History:  Diagnosis Date   Herpes simplex    Miscarriage 12/2022   Past Surgical History:  Procedure Laterality Date   NO PAST SURGERIES     OB History  Gravida Para Term Preterm AB Living  4    3   SAB IAB Ectopic Multiple Live Births  2  1      # Outcome Date GA Lbr Len/2nd Weight Sex Type Anes PTL Lv  4 Current           3 Ectopic           2 SAB           1 SAB      SAB     Patient denies any other pertinent gynecologic issues.  No current facility-administered medications on file prior to encounter.   Current Outpatient Medications on File Prior to Encounter  Medication Sig Dispense Refill   Prenatal Vit-Fe Fumarate-FA (PRENATAL MULTIVITAMIN) TABS tablet Take 1 tablet by mouth daily at 12 noon.     valACYclovir (VALTREX) 1000 MG tablet Take 1,000 mg by mouth as needed.     No Known Allergies  Social History:   reports that she has never smoked. She has never been exposed to tobacco smoke. She has never used smokeless tobacco. She reports that she does not currently use alcohol. She reports current drug use. Drug: Marijuana. Family History  Problem Relation Age of Onset   Diabetes Mother    Thyroid disease Father     Review of Systems: Pertinent items noted in HPI and remainder of comprehensive ROS otherwise negative.  PHYSICAL EXAM: Blood pressure 112/74, pulse (!) 110, temperature 98.2 F (36.8 C), temperature  source Oral, resp. rate 18, height 5' 1 (1.549 m), weight 72.4 kg, last menstrual period 03/14/2024, SpO2 97%, unknown if currently breastfeeding. CONSTITUTIONAL: Well-developed, well-nourished female in no acute distress.  HENT:  Normocephalic, atraumatic, External right and left ear normal. Oropharynx is clear and moist EYES: Conjunctivae and EOM are normal. Pupils are equal, round, and reactive to light. No scleral icterus.  NECK: Normal range of motion, supple, no masses SKIN: Skin is warm and dry. No rash noted. Not diaphoretic. No erythema. No pallor. NEUROLOGIC: Alert and oriented to person, place, and time. Normal reflexes, muscle tone coordination. No cranial nerve deficit noted. PSYCHIATRIC: Normal mood and affect. Normal behavior. Normal judgment and thought content. CARDIOVASCULAR: Normal heart rate noted, regular rhythm RESPIRATORY: Effort and breath sounds normal, no problems with respiration noted ABDOMEN: Soft, nontender, nondistended. PELVIC: deferred MUSCULOSKELETAL: Normal range of motion. No tenderness.  No cyanosis, clubbing, or edema.    Labs: Results for orders placed or performed during the hospital encounter of 05/18/24 (from the past 2 weeks)  Pregnancy, urine POC   Collection Time: 05/18/24  7:19 PM  Result Value Ref Range   Preg Test, Ur POSITIVE (A) NEGATIVE  Urinalysis, Routine w reflex microscopic -Urine, Clean Catch   Collection Time: 05/18/24  8:13 PM  Result Value Ref Range   Color, Urine YELLOW YELLOW   APPearance HAZY (A) CLEAR   Specific Gravity, Urine 1.018 1.005 - 1.030   pH 5.0 5.0 - 8.0   Glucose, UA NEGATIVE NEGATIVE mg/dL   Hgb urine dipstick NEGATIVE NEGATIVE   Bilirubin Urine NEGATIVE NEGATIVE   Ketones, ur NEGATIVE NEGATIVE mg/dL   Protein, ur NEGATIVE NEGATIVE mg/dL   Nitrite NEGATIVE NEGATIVE   Leukocytes,Ua NEGATIVE NEGATIVE  Comprehensive metabolic panel with GFR   Collection Time: 05/18/24  8:20 PM  Result Value Ref Range    Sodium 137 135 - 145 mmol/L   Potassium 3.6 3.5 - 5.1 mmol/L   Chloride 104 98 - 111 mmol/L   CO2 23 22 - 32 mmol/L   Glucose, Bld 101 (H) 70 - 99 mg/dL   BUN 8 6 - 20 mg/dL   Creatinine, Ser 9.37 0.44 - 1.00 mg/dL   Calcium 9.1 8.9 - 89.6 mg/dL   Total Protein 6.6 6.5 - 8.1 g/dL   Albumin 3.5 3.5 - 5.0 g/dL   AST 20 15 - 41 U/L   ALT 29 0 - 44 U/L   Alkaline Phosphatase 41 38 - 126 U/L   Total Bilirubin 0.5 0.0 - 1.2 mg/dL   GFR, Estimated >39 >39 mL/min   Anion gap 10 5 - 15  CBC   Collection Time: 05/18/24  8:20 PM  Result Value Ref Range   WBC 12.1 (H) 4.0 - 10.5 K/uL   RBC 4.45 3.87 - 5.11 MIL/uL   Hemoglobin 13.7 12.0 - 15.0 g/dL   HCT 59.7 63.9 - 53.9 %   MCV 90.3 80.0 - 100.0 fL   MCH 30.8 26.0 - 34.0 pg   MCHC 34.1 30.0 - 36.0 g/dL   RDW 87.2 88.4 - 84.4 %   Platelets 360 150 - 400 K/uL   nRBC 0.0 0.0 - 0.2 %  hCG, quantitative, pregnancy   Collection Time: 05/18/24  8:20 PM  Result Value Ref Range   hCG, Beta Chain, Quant, S 21,389 (H) <5 mIU/mL  ABO/Rh   Collection Time: 05/18/24  8:23 PM  Result Value Ref Range   ABO/RH(D) A NEG    Antibody Screen      NEG Performed at [facility], [address], [place], [place] [address]     Imaging Studies: US  OB LESS THAN 14 WEEKS WITH OB TRANSVAGINAL Result Date: 05/18/2024 EXAM: OBSTETRIC ULTRASOUND FIRST TRIMESTER TECHNIQUE: Transvaginal first trimester obstetric pelvic duplex ultrasound was performed with real-time imaging, color flow Doppler imaging, and spectral analysis. COMPARISON: None provided. CLINICAL HISTORY: First trimester pregnancy. FINDINGS: UTERUS: Right corneal ectopic with thinning of the overlying myometrium. No intrauterine gestation. GESTATIONAL SAC(S): No intrauterine gestational sac. YOLK SAC: Present EMBRYO(<11WK) /FETUS(>=11WK): Single CROWN RUMP LENGTH: 13.4 mm (7 weeks 4 days) RATE OF CARDIAC ACTIVITY: No cardiac activity. RIGHT OVARY: Unremarkable. Normal arterial and venous flow. LEFT  OVARY: Unremarkable. Normal arterial and venous flow. FREE FLUID: No free fluid. MEASUREMENTS ESTIMATED GESTATIONAL AGE BY CURRENT ULTRASOUND: 7 weeks 4 days IMPRESSION: 1. Right corneal ectopic pregnancy without cardiac activity, as above. 2. Critical Value/emergent results were called by telephone at the time of interpretation on 05/18/2024 at 2115 hrs to provider Dr Abigail. Electronically signed by: Pinkie Pebbles MD 05/18/2024 09:16 PM EDT RP Workstation: [id number]    Assessment: Active Problems:   Pregnancy, ectopic, cornual or cervical   Rh negative state in antepartum period Ultrasound with right cornual ectopic, no signs  of rupture. No heart tones noted. Patient is hemodynamically stable and asymptomatic at this time  Plan: Given cornual location, size of ectopic and HCG 21,389, not a good candidate for methotrexate . Recommend surgical management with laparoscopic unilateral salpingectomy, cornual ectopic resection.   She just ate chocolate bars before this conversation. Given that she is hemodynamically stable and no signs of rupture, will await NPO window.  The risks, benefits and alternatives of the procedure were reviewed in detail. The risks of surgery were discussed with the patient including but were not limited to:    -- Bleeding with associated risks of blood transfusion  -- Infection which may require antibiotics -- Injury to bowel, bladder, ureters or other surrounding organs --Thromboembolic phenomenon such as MI, CVA or death and other postoperative/anesthesia complications.     The patient concurred with the proposed plan, giving informed written consent for the procedure.    Rollo ONEIDA Bring, MD, FACOG Obstetrician & Gynecologist, [facility] for [facility], [facility]

## 2024-05-18 NOTE — MAU Note (Signed)
 Robin Hall is a 28 y.o. at Unknown here in MAU reporting: took pregnancy test 8/18 - positive. [facility] - [facility]  and they saw the baby but no heartbeat so was told to come here for possible miscarriage. Denies pain or VB.   LMP: 03/14/2024 Onset of complaint: today Pain score: 0 Vitals:   05/18/24 1924  BP: 112/74  Pulse: (!) 110  Resp: 18  Temp: 98.2 F (36.8 C)  SpO2: 97%     FHT: NA  Lab orders placed from triage: urine pregnancy; UA

## 2024-05-19 ENCOUNTER — Inpatient Hospital Stay (HOSPITAL_COMMUNITY): Payer: MEDICAID | Admitting: Certified Registered Nurse Anesthetist

## 2024-05-19 ENCOUNTER — Inpatient Hospital Stay (EMERGENCY_DEPARTMENT_HOSPITAL): Payer: MEDICAID | Admitting: Certified Registered Nurse Anesthetist

## 2024-05-19 ENCOUNTER — Other Ambulatory Visit: Payer: Self-pay

## 2024-05-19 ENCOUNTER — Encounter (HOSPITAL_COMMUNITY): Payer: Self-pay | Admitting: Obstetrics and Gynecology

## 2024-05-19 ENCOUNTER — Other Ambulatory Visit (HOSPITAL_COMMUNITY): Payer: Self-pay

## 2024-05-19 ENCOUNTER — Encounter (HOSPITAL_COMMUNITY)
Admission: AD | Disposition: A | Discharge status: Home / Self Care | Payer: Self-pay | Source: Home / Self Care | Attending: Obstetrics and Gynecology

## 2024-05-19 DIAGNOSIS — O008 Other ectopic pregnancy without intrauterine pregnancy: Secondary | ICD-10-CM

## 2024-05-19 DIAGNOSIS — Z3A01 Less than 8 weeks gestation of pregnancy: Secondary | ICD-10-CM

## 2024-05-19 DIAGNOSIS — Z3A00 Weeks of gestation of pregnancy not specified: Secondary | ICD-10-CM

## 2024-05-19 HISTORY — PX: DIAGNOSTIC LAPAROSCOPY WITH REMOVAL OF ECTOPIC PREGNANCY: SHX6449

## 2024-05-19 SURGERY — LAPAROSCOPY, WITH ECTOPIC PREGNANCY SURGICAL TREATMENT
Anesthesia: General

## 2024-05-19 MED ORDER — MEPERIDINE HCL 25 MG/ML IJ SOLN
6.2500 mg | INTRAMUSCULAR | Status: DC | PRN
  Filled 2024-05-19: qty 1

## 2024-05-19 MED ORDER — SODIUM CHLORIDE 0.9 % IR SOLN
Status: DC | PRN
  Administered 2024-05-19: 3000 mL

## 2024-05-19 MED ORDER — HYDROMORPHONE HCL 1 MG/ML IJ SOLN
0.2500 mg | INTRAMUSCULAR | Status: DC | PRN
  Administered 2024-05-19: 0.5 mg via INTRAVENOUS

## 2024-05-19 MED ORDER — PROPOFOL 10 MG/ML IV BOLUS
INTRAVENOUS | Status: AC
  Filled 2024-05-19: qty 20

## 2024-05-19 MED ORDER — DOCUSATE SODIUM 100 MG PO CAPS
100.0000 mg | ORAL_CAPSULE | Freq: Two times a day (BID) | ORAL | 0 refills | Status: AC
  Filled 2024-05-19: qty 10, 5d supply, fill #0

## 2024-05-19 MED ORDER — METHOTREXATE FOR ECTOPIC PREGNANCY
50.0000 mg/m2 | Freq: Once | INTRAMUSCULAR | Status: AC
Start: 2024-05-19 — End: 2024-05-19
  Administered 2024-05-19: 87.5 mg via INTRAMUSCULAR
  Filled 2024-05-19: qty 3.5

## 2024-05-19 MED ORDER — OXYCODONE HCL 5 MG/5ML PO SOLN: 5.0000 mg | Freq: Once | ORAL | Status: DC | PRN

## 2024-05-19 MED ORDER — PHENYLEPHRINE 80 MCG/ML (10ML) SYRINGE FOR IV PUSH (FOR BLOOD PRESSURE SUPPORT)
PREFILLED_SYRINGE | INTRAVENOUS | Status: DC | PRN
  Administered 2024-05-19: 80 ug via INTRAVENOUS
  Administered 2024-05-19: 160 ug via INTRAVENOUS

## 2024-05-19 MED ORDER — PROPOFOL 10 MG/ML IV BOLUS
INTRAVENOUS | Status: DC | PRN
  Administered 2024-05-19: 150 mg via INTRAVENOUS

## 2024-05-19 MED ORDER — IBUPROFEN 600 MG PO TABS
600.0000 mg | ORAL_TABLET | Freq: Four times a day (QID) | ORAL | 3 refills | Status: AC | PRN
  Filled 2024-05-19: qty 60, 15d supply, fill #0

## 2024-05-19 MED ORDER — DEXAMETHASONE SODIUM PHOSPHATE 10 MG/ML IJ SOLN
INTRAMUSCULAR | Status: DC | PRN
  Administered 2024-05-19: 10 mg via INTRAVENOUS

## 2024-05-19 MED ORDER — OXYCODONE HCL 5 MG PO TABS: 5.0000 mg | ORAL_TABLET | Freq: Once | ORAL | Status: DC | PRN

## 2024-05-19 MED ORDER — HEMOSTATIC AGENTS (NO CHARGE) OPTIME
TOPICAL | Status: DC | PRN
  Administered 2024-05-19: 1 via TOPICAL

## 2024-05-19 MED ORDER — LIDOCAINE 2% (20 MG/ML) 5 ML SYRINGE
INTRAMUSCULAR | Status: AC
  Filled 2024-05-19: qty 5

## 2024-05-19 MED ORDER — SUCCINYLCHOLINE CHLORIDE 200 MG/10ML IV SOSY
PREFILLED_SYRINGE | INTRAVENOUS | Status: AC
  Filled 2024-05-19: qty 10

## 2024-05-19 MED ORDER — BUPIVACAINE HCL (PF) 0.25 % IJ SOLN
INTRAMUSCULAR | Status: AC
  Filled 2024-05-19: qty 30

## 2024-05-19 MED ORDER — VASOPRESSIN 20 UNIT/ML IV SOLN
INTRAVENOUS | Status: DC | PRN
  Administered 2024-05-19: 30 mL via SURGICAL_CAVITY

## 2024-05-19 MED ORDER — PROPOFOL 500 MG/50ML IV EMUL
INTRAVENOUS | Status: DC | PRN
  Administered 2024-05-19: 40 ug/kg/min via INTRAVENOUS

## 2024-05-19 MED ORDER — EPHEDRINE SULFATE-NACL 50-0.9 MG/10ML-% IV SOSY
PREFILLED_SYRINGE | INTRAVENOUS | Status: DC | PRN
  Administered 2024-05-19 (×2): 5 mg via INTRAVENOUS

## 2024-05-19 MED ORDER — SUCCINYLCHOLINE CHLORIDE 200 MG/10ML IV SOSY
PREFILLED_SYRINGE | INTRAVENOUS | Status: DC | PRN
  Administered 2024-05-19: 120 mg via INTRAVENOUS

## 2024-05-19 MED ORDER — DEXMEDETOMIDINE HCL IN NACL 80 MCG/20ML IV SOLN
INTRAVENOUS | Status: DC | PRN
Start: 2024-05-19 — End: 2024-05-19
  Administered 2024-05-19: 10 ug via INTRAVENOUS

## 2024-05-19 MED ORDER — HYDROMORPHONE HCL 1 MG/ML IJ SOLN
INTRAMUSCULAR | Status: AC
  Filled 2024-05-19: qty 1

## 2024-05-19 MED ORDER — EPHEDRINE 5 MG/ML INJ
INTRAVENOUS | Status: AC
  Filled 2024-05-19: qty 5

## 2024-05-19 MED ORDER — ONDANSETRON HCL 4 MG/2ML IJ SOLN
INTRAMUSCULAR | Status: DC | PRN
  Administered 2024-05-19: 4 mg via INTRAVENOUS

## 2024-05-19 MED ORDER — 0.9 % SODIUM CHLORIDE (POUR BTL) OPTIME
TOPICAL | Status: DC | PRN
  Administered 2024-05-19: 1000 mL

## 2024-05-19 MED ORDER — FENTANYL CITRATE (PF) 250 MCG/5ML IJ SOLN
INTRAMUSCULAR | Status: DC | PRN
  Administered 2024-05-19 (×5): 50 ug via INTRAVENOUS

## 2024-05-19 MED ORDER — ACETAMINOPHEN 10 MG/ML IV SOLN
INTRAVENOUS | Status: DC | PRN
  Administered 2024-05-19: 1000 mg via INTRAVENOUS

## 2024-05-19 MED ORDER — PHENYLEPHRINE 80 MCG/ML (10ML) SYRINGE FOR IV PUSH (FOR BLOOD PRESSURE SUPPORT)
PREFILLED_SYRINGE | INTRAVENOUS | Status: AC
  Filled 2024-05-19: qty 10

## 2024-05-19 MED ORDER — RHO D IMMUNE GLOBULIN 1500 UNIT/2ML IJ SOSY
300.0000 ug | PREFILLED_SYRINGE | Freq: Once | INTRAMUSCULAR | Status: DC
  Filled 2024-05-19: qty 2

## 2024-05-19 MED ORDER — SUGAMMADEX SODIUM 200 MG/2ML IV SOLN
INTRAVENOUS | Status: DC | PRN
  Administered 2024-05-19: 144.8 mg via INTRAVENOUS

## 2024-05-19 MED ORDER — MIDAZOLAM HCL 2 MG/2ML IJ SOLN
INTRAMUSCULAR | Status: AC
  Filled 2024-05-19: qty 2

## 2024-05-19 MED ORDER — VASOPRESSIN 20 UNIT/ML IV SOLN
INTRAVENOUS | Status: AC
  Filled 2024-05-19: qty 1

## 2024-05-19 MED ORDER — DEXAMETHASONE SODIUM PHOSPHATE 10 MG/ML IJ SOLN
INTRAMUSCULAR | Status: AC
Start: 2024-05-19 — End: 2024-05-19
  Filled 2024-05-19: qty 1

## 2024-05-19 MED ORDER — MIDAZOLAM HCL 2 MG/2ML IJ SOLN
INTRAMUSCULAR | Status: DC | PRN
  Administered 2024-05-19: 2 mg via INTRAVENOUS

## 2024-05-19 MED ORDER — ROCURONIUM BROMIDE 10 MG/ML (PF) SYRINGE
PREFILLED_SYRINGE | INTRAVENOUS | Status: AC
  Filled 2024-05-19: qty 10

## 2024-05-19 MED ORDER — MIDAZOLAM HCL 2 MG/2ML IJ SOLN: 0.5000 mg | Freq: Once | INTRAMUSCULAR | Status: DC | PRN

## 2024-05-19 MED ORDER — PHENYLEPHRINE HCL-NACL 20-0.9 MG/250ML-% IV SOLN
INTRAVENOUS | Status: DC | PRN
  Administered 2024-05-19: 20 ug/min via INTRAVENOUS

## 2024-05-19 MED ORDER — RHO D IMMUNE GLOBULIN 1500 UNIT/2ML IJ SOSY
300.0000 ug | PREFILLED_SYRINGE | Freq: Once | INTRAMUSCULAR | Status: DC
  Administered 2024-05-19: 300 ug via INTRAMUSCULAR
  Filled 2024-05-19: qty 2

## 2024-05-19 MED ORDER — ROCURONIUM BROMIDE 10 MG/ML (PF) SYRINGE
PREFILLED_SYRINGE | INTRAVENOUS | Status: DC | PRN
  Administered 2024-05-19: 20 mg via INTRAVENOUS
  Administered 2024-05-19: 40 mg via INTRAVENOUS
  Administered 2024-05-19: 10 mg via INTRAVENOUS

## 2024-05-19 MED ORDER — ONDANSETRON HCL 4 MG/2ML IJ SOLN
INTRAMUSCULAR | Status: AC
  Filled 2024-05-19: qty 2

## 2024-05-19 MED ORDER — OXYCODONE-ACETAMINOPHEN 5-325 MG PO TABS
1.0000 | ORAL_TABLET | ORAL | 0 refills | Status: AC | PRN
  Filled 2024-05-19: qty 10, 2d supply, fill #0

## 2024-05-19 MED ORDER — FENTANYL CITRATE (PF) 250 MCG/5ML IJ SOLN
INTRAMUSCULAR | Status: AC
  Filled 2024-05-19: qty 5

## 2024-05-19 SURGICAL SUPPLY — 35 items
CHLORAPREP W/TINT 26 (MISCELLANEOUS) ×1 IMPLANT
DERMABOND ADVANCED .7 DNX12 (GAUZE/BANDAGES/DRESSINGS) IMPLANT
DRAPE POUCH INSTRU U-SHP 10X18 (DRAPES) IMPLANT
DRAPE SURG IRRIG POUCH 19X23 (DRAPES) ×1 IMPLANT
DRSG OPSITE POSTOP 3X4 (GAUZE/BANDAGES/DRESSINGS) ×3 IMPLANT
GLOVE BIO SURGEON STRL SZ 6.5 (GLOVE) ×1 IMPLANT
GLOVE BIOGEL PI IND STRL 6 (GLOVE) IMPLANT
GLOVE BIOGEL PI IND STRL 6.5 (GLOVE) ×1 IMPLANT
GOWN SRG XL LVL 4 BRTHBL STRL (GOWNS) IMPLANT
GOWN STRL REUS W/ TWL LRG LVL3 (GOWN DISPOSABLE) ×1 IMPLANT
KIT PINK PAD W/HEAD ARM REST (MISCELLANEOUS) ×1 IMPLANT
KIT TURNOVER KIT B (KITS) ×1 IMPLANT
LIGASURE VESSEL 5MM BLUNT TIP (ELECTROSURGICAL) IMPLANT
NDL SPNL 22GX7 QUINCKE BK (NEEDLE) IMPLANT
NEEDLE SPNL 22GX7 QUINCKE BK (NEEDLE) IMPLANT
NS IRRIG 1000ML POUR BTL (IV SOLUTION) ×1 IMPLANT
PACK LAPAROSCOPY BASIN (CUSTOM PROCEDURE TRAY) ×1 IMPLANT
POWDER SURGICEL 3.0 GRAM (HEMOSTASIS) IMPLANT
SCISSORS LAP 5X35 DISP (ENDOMECHANICALS) IMPLANT
SET TUBE SMOKE EVAC HIGH FLOW (TUBING) ×1 IMPLANT
SLEEVE Z-THREAD 5X100MM (TROCAR) ×1 IMPLANT
STRIP CLOSURE SKIN 1/2X4 (GAUZE/BANDAGES/DRESSINGS) ×1 IMPLANT
SUT MNCRL AB 4-0 PS2 18 (SUTURE) ×1 IMPLANT
SUT VICRYL 0 UR6 27IN ABS (SUTURE) ×1 IMPLANT
SUT VLOC 180 0 9IN GS21 (SUTURE) IMPLANT
SUT VLOC 180 2-0 9IN GS21 (SUTURE) IMPLANT
SUT VLOC 180 ABS0 18IN GS21 (SUTURE) IMPLANT
SYR CONTROL 10ML LL (SYRINGE) IMPLANT
SYSTEM BAG RETRIEVAL 10MM (BASKET) IMPLANT
TIP ENDOSCOPIC SURGICEL (TIP) IMPLANT
TOWEL GREEN STERILE FF (TOWEL DISPOSABLE) ×2 IMPLANT
TRAY FOLEY W/BAG SLVR 14FR (SET/KITS/TRAYS/PACK) ×1 IMPLANT
TROCAR BALLN 12MMX100 BLUNT (TROCAR) ×1 IMPLANT
TROCAR Z-THREAD OPTICAL 5X100M (TROCAR) ×1 IMPLANT
WARMER LAPAROSCOPE (MISCELLANEOUS) ×1 IMPLANT

## 2024-05-19 NOTE — Anesthesia Preprocedure Evaluation (Signed)
 Anesthesia Evaluation  Patient identified by MRN, date of birth, ID band Patient awake    Reviewed: Allergy & Precautions, NPO status , Patient's Chart, lab work & pertinent test results  History of Anesthesia Complications Negative for: history of anesthetic complications  Airway Mallampati: I  TM Distance: >3 FB Neck ROM: Full    Dental  (+) Dental Advisory Given   Pulmonary Current Smoker and Patient abstained from smoking.   breath sounds clear to auscultation       Cardiovascular negative cardio ROS  Rhythm:Regular Rate:Normal     Neuro/Psych negative neurological ROS     GI/Hepatic negative GI ROS, Neg liver ROS,,,  Endo/Other    Renal/GU negative Renal ROS     Musculoskeletal   Abdominal   Peds  Hematology negative hematology ROS (+)   Anesthesia Other Findings   Reproductive/Obstetrics (+) Pregnancy (ectopic)                              Anesthesia Physical Anesthesia Plan  ASA: 2  Anesthesia Plan: General   Post-op Pain Management: Ofirmev  IV (intra-op)*   Induction: Intravenous  PONV Risk Score and Plan: 2 and Ondansetron  and Dexamethasone   Airway Management Planned: Oral ETT  Additional Equipment: None  Intra-op Plan:   Post-operative Plan: Extubation in OR  Informed Consent: I have reviewed the patients History and Physical, chart, labs and discussed the procedure including the risks, benefits and alternatives for the proposed anesthesia with the patient or authorized representative who has indicated his/her understanding and acceptance.     Dental advisory given  Plan Discussed with: CRNA and Surgeon  Anesthesia Plan Comments:         Anesthesia Quick Evaluation

## 2024-05-19 NOTE — Discharge Instructions (Signed)
 You received methotrexate  to treated any residual pregnancy tissue after your surgery. There can be side effects of photosensitivity & GI upset.  You should avoid direct sunlight and abstain from alcohol, NSAIDs and sexual intercourse for two weeks. If you are taking any multivitamins with folic acid you should discontinue them.  ?Call or return to the MAU with any abdominal pain, vomiting, fainting, or fever.  You will need to have weekly labs to follow your HCG levels to negative.

## 2024-05-19 NOTE — Anesthesia Postprocedure Evaluation (Signed)
 Anesthesia Post Note  Patient: Robin Hall  Procedure(s) Performed: LAPAROSCOPY, WITH REMOVAL OF RIGHT CORNEAL ECTOPIC PREGNANCY     Patient location during evaluation: PACU Anesthesia Type: General Level of consciousness: awake and alert Pain management: pain level controlled Vital Signs Assessment: post-procedure vital signs reviewed and stable Respiratory status: spontaneous breathing, nonlabored ventilation and respiratory function stable Cardiovascular status: blood pressure returned to baseline and stable Postop Assessment: no apparent nausea or vomiting Anesthetic complications: no   No notable events documented.  Last Vitals:  Vitals:   05/19/24 0945 05/19/24 1000  BP: 130/72 128/69  Pulse: 63 (!) 53  Resp: 16 15  Temp:    SpO2: 100% 99%    Last Pain:  Vitals:   05/19/24 0850  TempSrc:   PainSc: 10-Worst pain ever                 Butler Levander Pinal

## 2024-05-19 NOTE — Anesthesia Procedure Notes (Signed)
 Procedure Name: Intubation Date/Time: 05/19/2024 6:19 AM  Performed by: Roddie Grate, CRNAPre-anesthesia Checklist: Patient identified, Emergency Drugs available, Suction available, Patient being monitored and Timeout performed Patient Re-evaluated:Patient Re-evaluated prior to induction Oxygen Delivery Method: Circle system utilized Preoxygenation: Pre-oxygenation with 100% oxygen Induction Type: IV induction, Rapid sequence and Cricoid Pressure applied Laryngoscope Size: Mac and 4 Grade View: Grade I Tube type: Oral Number of attempts: 1 Airway Equipment and Method: Stylet Placement Confirmation: ETT inserted through vocal cords under direct vision, positive ETCO2 and breath sounds checked- equal and bilateral Secured at: 22 cm Tube secured with: Tape Dental Injury: Teeth and Oropharynx as per pre-operative assessment  Comments: Smooth IV Induction. Eyes taped. RSI Performed. DL x 1 with grade 1 view. Atraumatically placed, teeth and lip remain intact as pre-op. Secured with tape. Bilateral breath sounds +/=, EtCO2 +, Adequate TV, VSS.

## 2024-05-19 NOTE — MAU Note (Signed)
 Pt transported to PACU in main OR.

## 2024-05-19 NOTE — Op Note (Addendum)
 Carolynne Wenig PROCEDURE DATE: 05/18/2024 - 05/19/2024  PREOPERATIVE DIAGNOSIS: Right cornual ectopic pregnancy  POSTOPERATIVE DIAGNOSIS: Same  PROCEDURE: Laparoscopic right salpingectomy and cornuostomy with removal of ectopic pregnancy  SURGEON:  Dr. Rollo T. Abigail, MD  ASSISTANT: Vina Solian, MD  ANESTHESIOLOGY TEAM: Anesthesiologist: Leonce Athens, MD; Cleotilde Butler Dade, MD CRNA: Zelphia Norleen HERO, CRNA; Roddie Grate, CRNA; Claudene Florina Boga, CRNA  INDICATIONS: 28 y.o. [phone] with right cornual ectopic pregnancy at [redacted]w[redacted]d  FINDINGS:  Dilated and edematous right cornua with ectopic pregnancy Normal ovaries and fallopian tubes    ANESTHESIA: General  INTRAVENOUS FLUIDS: 1000 ml  ESTIMATED BLOOD LOSS: 15 ml  URINE OUTPUT: void prior to discharge  SPECIMENS: Right fallopian tube, cornual tissue and ectopic pregnancy  COMPLICATIONS: None immediate  PROCEDURE IN DETAIL:  The patient was taken to the operating room where general anesthesia was administered and was found to be adequate.  She was placed in the dorsal lithotomy position, and was prepped and draped in a sterile manner.  A uterine manipulator was placed.    A skin incision was made with the scalpel inferior to the umbilicus. The abdomen was entered routinely with [facility] technique. The [facility] port was placed and the abdomen insufflated to 15 mmHg. The scalpel was then used to make two lateral 5mm incisions, one on the right, one on the left.  Two 5mm ports were inserted under direct visualization. The patient was placed in trendelenburg.  Attention was then turned to the right side where the right fallopian tube was isolated, the mesosalpinx cauterized and transected and removed at the cornua. Next, 30 ml of dilute vasopressin  (20 units in 100 ml of saline) was injected into the myometrium surrounding the ectopic in the right cornua. A purse string suture of 0 V-loc was performed. The cornual ectopic was incised  and the ectopic material removed and irrigated. The redundant cornual tissue was amputated. All ectopic material was removed in a bag through the middle port.. The purse string suture was tightened. Surgicel powder was applied. Good hemostasis was noted.  The specimen was removed through the central port.  The abdomen was desufflated, and all instruments were removed.  The umbilical port fascia was closed with 0-vicryl. The manipulator was removed from the vagina.  The incisions were then closed in a subcuticular fashion with 4-0 monocryl and dermabond and dressings were placed. She was taken to recovery in stable condition.   Rh status: negative - Rhogam to be given  She will also receive methotrexate  to treat any residual ectopic material that may remain.   The patient will be discharged to home as per PACU criteria.  Routine postoperative instructions given.     Rollo ONEIDA Abigail, MD, FACOG Obstetrician & Gynecologist, [facility] for [facility], [facility]

## 2024-05-19 NOTE — Transfer of Care (Signed)
 Immediate Anesthesia Transfer of Care Note  Patient: Robin Hall  Procedure(s) Performed: LAPAROSCOPY, WITH REMOVAL OF RIGHT CORNEAL ECTOPIC PREGNANCY  Patient Location: PACU  Anesthesia Type:General  Level of Consciousness: awake  Airway & Oxygen Therapy: Patient Spontanous Breathing  Post-op Assessment: Report given to RN and Post -op Vital signs reviewed and stable  Post vital signs: Reviewed and stable  Last Vitals:  Vitals Value Taken Time  BP 120/80   Temp 98   Pulse 80   Resp 16   SpO2 98     Last Pain:  Vitals:   05/18/24 1924  TempSrc: Oral         Complications: No notable events documented.

## 2024-05-20 ENCOUNTER — Encounter (HOSPITAL_COMMUNITY): Payer: Self-pay | Admitting: Obstetrics and Gynecology

## 2024-05-20 ENCOUNTER — Telehealth: Payer: Self-pay | Admitting: *Deleted

## 2024-05-20 DIAGNOSIS — O3680X0 Pregnancy with inconclusive fetal viability, not applicable or unspecified: Secondary | ICD-10-CM

## 2024-05-20 LAB — RH IG WORKUP (INCLUDES ABO/RH)
Gestational Age(Wks): 9.3
Unit division: 0

## 2024-05-20 LAB — SURGICAL PATHOLOGY

## 2024-05-20 NOTE — Telephone Encounter (Signed)
 Received a voice message from MaryAnn at [facility] from yesterday pm stating they are referring this patient. States she had US  05/18/24 and by LMP of 01/19/24 should have been [redacted]w[redacted]d but on US  measured [redacted]w[redacted]d without FHR. States patient has hx SAB. Per chart review went to MAU 05/18/24 and found to have ectopic pregnancy and has surgery to remove ectopic. Has follow up scheduled in [facility] office. I called [facility] and left a message as call rolled over to other location that we got the referral and they may call me back if needed. Rock Skip PEAK

## 2024-05-21 ENCOUNTER — Telehealth: Payer: Self-pay | Admitting: Obstetrics and Gynecology

## 2024-05-21 ENCOUNTER — Ambulatory Visit: Payer: Self-pay | Admitting: Obstetrics and Gynecology

## 2024-05-21 NOTE — Telephone Encounter (Signed)
 Spoke to patient by phone to check in after surgery. Recovering appropriately. She received rhogam and methotrexate  in PACU. Reviewed surgery and treatment course and plans for follow up with serial labs. Reviewed pathology consistent with surgical findings. All questions answered.  Future Appointments  Date Time Provider Department Center  05/26/2024  1:00 PM [facility] None  06/03/2024 11:15 AM Ladavion Savitz, Rollo DASEN, MD [facility] None    Rollo DASEN Bring, MD, FACOG Obstetrician & Gynecologist, [facility] for [facility], [facility]

## 2024-05-22 ENCOUNTER — Telehealth: Payer: Self-pay

## 2024-05-22 NOTE — Telephone Encounter (Signed)
 Attempted to return TC to pt about questions related to her ectopic pregnancy/surgery. No answer, left VM with callback.

## 2024-05-26 ENCOUNTER — Other Ambulatory Visit: Payer: Self-pay

## 2024-06-03 ENCOUNTER — Encounter: Payer: Self-pay | Admitting: Obstetrics and Gynecology

## 2024-06-05 ENCOUNTER — Other Ambulatory Visit: Payer: Self-pay

## 2024-06-25 ENCOUNTER — Encounter: Payer: Self-pay | Admitting: Obstetrics and Gynecology
